# Patient Record
Sex: Female | Born: 1985 | Race: Black or African American | Hispanic: No | Marital: Single | Smoking: Never smoker
Health system: Southern US, Community
[De-identification: ages and names within clinical notes are randomized; demographics above are authoritative.]

## PROBLEM LIST (undated history)

## (undated) DIAGNOSIS — D649 Anemia, unspecified: Secondary | ICD-10-CM

## (undated) DIAGNOSIS — B009 Herpesviral infection, unspecified: Secondary | ICD-10-CM

## (undated) DIAGNOSIS — A539 Syphilis, unspecified: Secondary | ICD-10-CM

## (undated) DIAGNOSIS — K829 Disease of gallbladder, unspecified: Secondary | ICD-10-CM

## (undated) HISTORY — PX: DILATION AND CURETTAGE OF UTERUS: SHX78

## (undated) HISTORY — DX: Syphilis, unspecified: A53.9

---

## 2008-03-05 ENCOUNTER — Observation Stay: Payer: Self-pay

## 2008-03-08 ENCOUNTER — Inpatient Hospital Stay: Payer: Self-pay

## 2011-02-28 ENCOUNTER — Emergency Department (HOSPITAL_COMMUNITY)
Admission: EM | Admit: 2011-02-28 | Discharge: 2011-02-28 | Disposition: A | Discharge status: Home / Self Care | Payer: BC Managed Care – PPO | Attending: Emergency Medicine | Admitting: Emergency Medicine

## 2011-02-28 DIAGNOSIS — R21 Rash and other nonspecific skin eruption: Secondary | ICD-10-CM | POA: Insufficient documentation

## 2012-08-07 ENCOUNTER — Emergency Department: Payer: Self-pay | Admitting: *Deleted

## 2012-08-08 ENCOUNTER — Encounter (HOSPITAL_COMMUNITY): Payer: Self-pay | Admitting: *Deleted

## 2012-08-08 ENCOUNTER — Emergency Department (HOSPITAL_COMMUNITY)
Admission: EM | Admit: 2012-08-08 | Discharge: 2012-08-09 | Disposition: A | Discharge status: Home / Self Care | Payer: No Typology Code available for payment source | Attending: Emergency Medicine | Admitting: Emergency Medicine

## 2012-08-08 DIAGNOSIS — R10819 Abdominal tenderness, unspecified site: Secondary | ICD-10-CM | POA: Diagnosis not present

## 2012-08-08 DIAGNOSIS — A599 Trichomoniasis, unspecified: Secondary | ICD-10-CM | POA: Insufficient documentation

## 2012-08-08 DIAGNOSIS — M79609 Pain in unspecified limb: Secondary | ICD-10-CM | POA: Insufficient documentation

## 2012-08-08 DIAGNOSIS — R109 Unspecified abdominal pain: Secondary | ICD-10-CM | POA: Insufficient documentation

## 2012-08-08 DIAGNOSIS — N949 Unspecified condition associated with female genital organs and menstrual cycle: Secondary | ICD-10-CM | POA: Diagnosis not present

## 2012-08-08 DIAGNOSIS — T1490XA Injury, unspecified, initial encounter: Secondary | ICD-10-CM | POA: Insufficient documentation

## 2012-08-08 HISTORY — DX: Herpesviral infection, unspecified: B00.9

## 2012-08-08 MED ORDER — SODIUM CHLORIDE 0.9 % IV BOLUS (SEPSIS)
1000.0000 mL | Freq: Once | INTRAVENOUS | Status: AC
  Administered 2012-08-08: 1000 mL via INTRAVENOUS

## 2012-08-08 MED ORDER — ONDANSETRON 4 MG PO TBDP
4.0000 mg | ORAL_TABLET | Freq: Once | ORAL | Status: AC
  Administered 2012-08-08: 4 mg via ORAL
  Filled 2012-08-08: qty 1
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
## 2012-08-09 ENCOUNTER — Emergency Department (HOSPITAL_COMMUNITY): Payer: No Typology Code available for payment source

## 2012-08-09 ENCOUNTER — Encounter (HOSPITAL_COMMUNITY): Payer: Self-pay | Admitting: Radiology

## 2012-08-09 DIAGNOSIS — T1490XA Injury, unspecified, initial encounter: Secondary | ICD-10-CM | POA: Diagnosis not present

## 2012-08-09 LAB — COMPREHENSIVE METABOLIC PANEL
AST: 15 U/L (ref 0–37)
Alkaline Phosphatase: 39 U/L (ref 39–117)
BUN: 15 mg/dL (ref 6–23)
CO2: 25 mEq/L (ref 19–32)
Chloride: 104 mEq/L (ref 96–112)
Creatinine, Ser: 0.84 mg/dL (ref 0.50–1.10)
GFR calc non Af Amer: >90 mL/min (ref >90)
Potassium: 3.8 mEq/L (ref 3.5–5.1)
Total Bilirubin: 0.3 mg/dL (ref 0.3–1.2)

## 2012-08-09 LAB — CBC WITH DIFFERENTIAL/PLATELET
Basophils Absolute: 0 10*3/uL (ref 0.0–0.1)
HCT: 36.4 % (ref 36.0–46.0)
Hemoglobin: 12.4 g/dL (ref 12.0–15.0)
Lymphocytes Relative: 46 % (ref 12–46)
Monocytes Absolute: 0.4 10*3/uL (ref 0.1–1.0)
Monocytes Relative: 7 % (ref 3–12)
Neutro Abs: 2.5 10*3/uL (ref 1.7–7.7)
WBC: 5.5 10*3/uL (ref 4.0–10.5)

## 2012-08-09 LAB — URINALYSIS, ROUTINE W REFLEX MICROSCOPIC
Ketones, ur: NEGATIVE mg/dL
Protein, ur: NEGATIVE mg/dL
Urobilinogen, UA: 1 mg/dL (ref 0.0–1.0)

## 2012-08-09 LAB — URINE MICROSCOPIC-ADD ON

## 2012-08-09 LAB — WET PREP, GENITAL: Yeast Wet Prep HPF POC: NONE SEEN

## 2012-08-09 LAB — POCT PREGNANCY, URINE: Preg Test, Ur: NEGATIVE

## 2012-08-09 MED ORDER — METRONIDAZOLE 500 MG PO TABS
2000.0000 mg | ORAL_TABLET | Freq: Once | ORAL | Status: AC
  Administered 2012-08-09: 2000 mg via ORAL
  Filled 2012-08-09: qty 4

## 2012-08-09 MED ORDER — IOHEXOL 300 MG/ML  SOLN
20.0000 mL | INTRAMUSCULAR | Status: DC
  Administered 2012-08-09: 20 mL via ORAL

## 2012-08-09 MED ORDER — IOHEXOL 300 MG/ML  SOLN
100.0000 mL | Freq: Once | INTRAMUSCULAR | Status: AC | PRN
  Administered 2012-08-09: 100 mL via INTRAVENOUS
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
## 2012-08-13 LAB — GC/CHLAMYDIA PROBE AMP, GENITAL
Chlamydia, DNA Probe: POSITIVE — AB
GC Probe Amp, Genital: NEGATIVE

## 2012-08-13 NOTE — ED Provider Notes (Signed)
Medical screening examination/treatment/procedure(s) were performed by non-physician practitioner and as supervising physician I was immediately available for consultation/collaboration.   Loren Racer, MD 08/13/12 2004

## 2012-08-14 NOTE — ED Notes (Signed)
+  Chlamydia Chart sent to EDP office for review.  

## 2012-08-15 NOTE — ED Notes (Signed)
rx for Azithromycin 1 gram po per Mardella Layman

## 2012-08-16 ENCOUNTER — Telehealth (HOSPITAL_COMMUNITY): Payer: Self-pay | Admitting: *Deleted

## 2012-08-16 NOTE — ED Notes (Signed)
Voice mail message left for patient to return call. 

## 2012-08-17 ENCOUNTER — Telehealth (HOSPITAL_COMMUNITY): Payer: Self-pay | Admitting: Emergency Medicine

## 2012-08-17 NOTE — ED Notes (Signed)
Wants Rx called to [facility] on [address] in [place], [place].

## 2014-09-10 ENCOUNTER — Emergency Department: Payer: Self-pay | Admitting: Internal Medicine

## 2014-09-10 LAB — CBC WITH DIFFERENTIAL/PLATELET
Basophil #: 0 10*3/uL (ref 0.0–0.1)
Basophil %: 0.3 %
Eosinophil #: 0 10*3/uL (ref 0.0–0.7)
Eosinophil %: 0.3 %
HCT: 38.2 % (ref 35.0–47.0)
HGB: 12.4 g/dL (ref 12.0–16.0)
LYMPHS PCT: 21 %
Lymphocyte #: 1.4 10*3/uL (ref 1.0–3.6)
MCH: 29.2 pg (ref 26.0–34.0)
MCHC: 32.5 g/dL (ref 32.0–36.0)
MCV: 90 fL (ref 80–100)
MONO ABS: 0.5 x10 3/mm (ref 0.2–0.9)
MONOS PCT: 7.6 %
Neutrophil #: 4.6 10*3/uL (ref 1.4–6.5)
Neutrophil %: 70.8 %
Platelet: 312 10*3/uL (ref 150–440)
RBC: 4.26 10*6/uL (ref 3.80–5.20)
RDW: 13.3 % (ref 11.5–14.5)
WBC: 6.5 10*3/uL (ref 3.6–11.0)

## 2014-09-10 LAB — COMPREHENSIVE METABOLIC PANEL
ALK PHOS: 51 U/L
Albumin: 3.5 g/dL (ref 3.4–5.0)
Anion Gap: 6 — ABNORMAL LOW (ref 7–16)
BUN: 11 mg/dL (ref 7–18)
Bilirubin,Total: 0.5 mg/dL (ref 0.2–1.0)
CALCIUM: 9 mg/dL (ref 8.5–10.1)
CHLORIDE: 104 mmol/L (ref 98–107)
CO2: 25 mmol/L (ref 21–32)
Creatinine: 0.8 mg/dL (ref 0.60–1.30)
Glucose: 79 mg/dL (ref 65–99)
Osmolality: 268 (ref 275–301)
POTASSIUM: 3.8 mmol/L (ref 3.5–5.1)
SGOT(AST): 24 U/L (ref 15–37)
SGPT (ALT): 38 U/L
SODIUM: 135 mmol/L — AB (ref 136–145)
TOTAL PROTEIN: 8.3 g/dL — AB (ref 6.4–8.2)

## 2014-09-10 LAB — LIPASE, BLOOD: LIPASE: 151 U/L (ref 73–393)

## 2014-09-13 ENCOUNTER — Emergency Department: Payer: Self-pay | Admitting: Emergency Medicine

## 2014-09-13 LAB — CBC WITH DIFFERENTIAL/PLATELET
BASOS ABS: 0 10*3/uL (ref 0.0–0.1)
BASOS PCT: 0.6 %
EOS ABS: 0 10*3/uL (ref 0.0–0.7)
Eosinophil %: 0.4 %
HCT: 40 % (ref 35.0–47.0)
HGB: 13.2 g/dL (ref 12.0–16.0)
LYMPHS ABS: 1.4 10*3/uL (ref 1.0–3.6)
LYMPHS PCT: 22.7 %
MCH: 29.7 pg (ref 26.0–34.0)
MCHC: 33.1 g/dL (ref 32.0–36.0)
MCV: 90 fL (ref 80–100)
MONO ABS: 0.5 x10 3/mm (ref 0.2–0.9)
Monocyte %: 7.2 %
NEUTROS PCT: 69.1 %
Neutrophil #: 4.4 10*3/uL (ref 1.4–6.5)
Platelet: 312 10*3/uL (ref 150–440)
RBC: 4.45 10*6/uL (ref 3.80–5.20)
RDW: 13.3 % (ref 11.5–14.5)
WBC: 6.3 10*3/uL (ref 3.6–11.0)

## 2014-09-13 LAB — URINALYSIS, COMPLETE
BLOOD: NEGATIVE
Bilirubin,UR: NEGATIVE
GLUCOSE, UR: NEGATIVE mg/dL (ref 0–75)
Nitrite: NEGATIVE
PH: 5 (ref 4.5–8.0)
SPECIFIC GRAVITY: 1.026 (ref 1.003–1.030)

## 2014-09-13 LAB — COMPREHENSIVE METABOLIC PANEL
ALBUMIN: 3.6 g/dL (ref 3.4–5.0)
ALK PHOS: 58 U/L
ANION GAP: 7 (ref 7–16)
BILIRUBIN TOTAL: 0.5 mg/dL (ref 0.2–1.0)
BUN: 11 mg/dL (ref 7–18)
CHLORIDE: 104 mmol/L (ref 98–107)
CREATININE: 0.79 mg/dL (ref 0.60–1.30)
Calcium, Total: 8.9 mg/dL (ref 8.5–10.1)
Co2: 26 mmol/L (ref 21–32)
Glucose: 81 mg/dL (ref 65–99)
OSMOLALITY: 272 (ref 275–301)
Potassium: 4.1 mmol/L (ref 3.5–5.1)
SGOT(AST): 31 U/L (ref 15–37)
SGPT (ALT): 53 U/L
SODIUM: 137 mmol/L (ref 136–145)
TOTAL PROTEIN: 8.9 g/dL — AB (ref 6.4–8.2)

## 2014-09-13 LAB — PREGNANCY, URINE: PREGNANCY TEST, URINE: POSITIVE m[IU]/mL

## 2015-01-01 ENCOUNTER — Ambulatory Visit: Payer: Self-pay | Admitting: Obstetrics and Gynecology

## 2015-01-11 ENCOUNTER — Encounter: Payer: Self-pay | Admitting: Obstetrics and Gynecology

## 2015-01-12 ENCOUNTER — Ambulatory Visit
Admit: 2015-01-12 | Disposition: A | Discharge status: Home / Self Care | Payer: Self-pay | Attending: Obstetrics and Gynecology | Admitting: Obstetrics and Gynecology

## 2015-01-12 ENCOUNTER — Encounter
Admit: 2015-01-12 | Disposition: A | Discharge status: Home / Self Care | Payer: Self-pay | Attending: Obstetrics & Gynecology | Admitting: Obstetrics & Gynecology

## 2015-02-08 ENCOUNTER — Inpatient Hospital Stay
Admit: 2015-02-08 | Disposition: A | Discharge status: Home / Self Care | Payer: Self-pay | Attending: Advanced Practice Midwife | Admitting: Advanced Practice Midwife

## 2015-02-08 LAB — CBC WITH DIFFERENTIAL/PLATELET
BASOS ABS: 0 10*3/uL (ref 0.0–0.1)
BASOS PCT: 0.2 %
EOS PCT: 0.5 %
Eosinophil #: 0 10*3/uL (ref 0.0–0.7)
HCT: 32.5 % — AB (ref 35.0–47.0)
HGB: 10.6 g/dL — AB (ref 12.0–16.0)
LYMPHS ABS: 1.5 10*3/uL (ref 1.0–3.6)
LYMPHS PCT: 18.1 %
MCH: 28.9 pg (ref 26.0–34.0)
MCHC: 32.7 g/dL (ref 32.0–36.0)
MCV: 88 fL (ref 80–100)
MONOS PCT: 6.5 %
Monocyte #: 0.5 x10 3/mm (ref 0.2–0.9)
NEUTROS ABS: 6 10*3/uL (ref 1.4–6.5)
Neutrophil %: 74.7 %
Platelet: 253 10*3/uL (ref 150–440)
RBC: 3.69 10*6/uL — AB (ref 3.80–5.20)
RDW: 13.8 % (ref 11.5–14.5)
WBC: 8.1 10*3/uL (ref 3.6–11.0)

## 2015-02-08 LAB — GC/CHLAMYDIA PROBE AMP

## 2015-02-09 LAB — HEMATOCRIT: HCT: 31.7 % — ABNORMAL LOW (ref 35.0–47.0)

## 2015-02-11 ENCOUNTER — Encounter
Admit: 2015-02-11 | Disposition: A | Discharge status: Home / Self Care | Payer: Self-pay | Attending: Obstetrics and Gynecology | Admitting: Obstetrics and Gynecology

## 2015-02-11 ENCOUNTER — Ambulatory Visit
Admit: 2015-02-11 | Disposition: A | Discharge status: Home / Self Care | Payer: Self-pay | Admitting: Obstetrics and Gynecology

## 2015-02-11 LAB — COMPREHENSIVE METABOLIC PANEL
ALK PHOS: 72 U/L
ANION GAP: 5 — AB (ref 7–16)
Albumin: 2.7 g/dL — ABNORMAL LOW
BILIRUBIN TOTAL: 0.4 mg/dL
BUN: 11 mg/dL
CALCIUM: 8.7 mg/dL — AB
Chloride: 107 mmol/L
Co2: 25 mmol/L
Creatinine: 0.7 mg/dL
EGFR (Non-African Amer.): >60
GLUCOSE: 79 mg/dL
POTASSIUM: 3.8 mmol/L
SGOT(AST): 36 U/L
SGPT (ALT): 31 U/L
Sodium: 137 mmol/L
Total Protein: 6 g/dL — ABNORMAL LOW

## 2015-02-11 LAB — CBC
HCT: 27.4 % — AB (ref 35.0–47.0)
HGB: 9.1 g/dL — AB (ref 12.0–16.0)
MCH: 29.3 pg (ref 26.0–34.0)
MCHC: 33.2 g/dL (ref 32.0–36.0)
MCV: 88 fL (ref 80–100)
Platelet: 252 10*3/uL (ref 150–440)
RBC: 3.1 10*6/uL — ABNORMAL LOW (ref 3.80–5.20)
RDW: 14 % (ref 11.5–14.5)
WBC: 9.6 10*3/uL (ref 3.6–11.0)

## 2015-02-11 LAB — HCG, QUANTITATIVE, PREGNANCY: Beta Hcg, Quant.: 4125 m[IU]/mL — ABNORMAL HIGH

## 2015-02-12 ENCOUNTER — Encounter
Admit: 2015-02-12 | Disposition: A | Discharge status: Home / Self Care | Payer: Self-pay | Attending: Obstetrics and Gynecology | Admitting: Obstetrics and Gynecology

## 2015-03-08 LAB — SURGICAL PATHOLOGY

## 2015-03-11 ENCOUNTER — Encounter
Admit: 2015-03-11 | Disposition: A | Discharge status: Home / Self Care | Payer: Self-pay | Attending: Obstetrics and Gynecology | Admitting: Obstetrics and Gynecology

## 2015-03-14 NOTE — Consult Note (Signed)
Referral Information:  Reason for Referral Follow up of recent pregnancy loss at 29 weeks on 02/08/15.   Referring Physician Dr. Vergie LivingPickens, [facility]   Prenatal Hx 29 yo [name] weeks after an IUFD and delivery complicated by retained products requiring manual delivery of the placenta, followed by D&C on 3/31. Pregnancy was complicated by fetal abnormalities including:  lagging growth, large abdomen concerning for hepatosplenomegaly, echogenic bowel, short/abnormal long bones.  Serologic testing was done after delivery. RPR was found to be newly positive (was negative on 09/2014 at the time new ob labs were drawn) with a titer of 1:256 and positive FTA. Her anticardiolipin antibody (IgG) was slightly elevated.   Since 3/31 she has been doing well; no problems with her breasts, occasional spotting, no return yet of menses.  She has resumed intercourse and has used condoms.   Past Obstetrical Hx 1.  2009 NVD term female,  [facility] 2.  Sab x1  3.  01/31/2015 - 29 week IUFD, retained placenta   Allergies:   No Known Allergies:   (Removed):    Impression/Recommendations:  Impression 1.  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  Received PCN G 2.4 million units IM x 1 on 3/31. 2.  IUFD - pregnancy complicated by abnormal serum screen with HCG 4 MOM and normal cell free fetal DNA.  Fetus subsequently developed hepatosplenomegaly and growth restriction (excluding AC measurement).  - Autopsy was declined but babygram and placental pathology and microarray were performed. a. Placental pathology revealed chronic villitis, pigment containing macrophages within umbilical cord and membranes, large for gestational age placenta and acute chorioamnionitis.  Per discussion with the pathologist, although infectious studies performed on the placenta (specifically for Treponema) were negative, evidence of extensive infection within the placenta, recent  seroconverion of the mother and abnormalities in the fetus are all consistent with syphilis as the explantation for the demise. b. Microarray consistent with normal female c. Babygram - no obvious fractures involving the skeleton, no limb shortening or skull fractures;grossly normal. 3.  Mildly elevated ACL.   Recommendations 1.   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  She thinks her partner was also treated.  Recommend repeat RPR titer (she is seeing [facility] this week and will get this done at that visit); will need to repeat (if appropriately dropping) at 6 months and 12 months.  If titers are not appropriately dropping, will need retreatment. 2.  [facility] 3.  Consider repeat draw of the ACL after 6 weeks from the first (2 weeks from now). 4.  Mood stable - no ongoing depressive symptoms.    Total Time Spent with Patient 15 minutes   >50% of visit spent in couseling/coordination of care yes   Office Use Only 99213  Office Visit Level 3 (15min) EST exp prob focused outpt   Coding Description: OTHER: IUFD.  Maternal syphilis.  Electronic Signatures: Kirby FunkEllestad, Angela Dougherty (MD)  (Signed 28-Apr-16 11:07)  Authored: Referral, Home Medications, Allergies, Exam, Lab/Radiology Notes, Impression, Billing, Coding Description   Last Updated: 28-Apr-16 11:07 by Kirby FunkEllestad, Amarise Lillo (MD)

## 2015-03-14 NOTE — Op Note (Signed)
PATIENT NAME:  Angela Dougherty, Caidence N MR#:  [id number] DATE OF BIRTH:  Jun 29, 1986  DATE OF PROCEDURE:  02/11/2015  PREOPERATIVE DIAGNOSES: 1.  Heavy vaginal bleeding.  2.  Retained products of conception.   POSTOPERATIVE DIAGNOSES:  1.  Heavy vaginal bleeding.  2.  Retained products of conception.   PROCEDURE: Dilation and curettage.   ANESTHESIA: General.   SURGEON: Conard NovakStephen D. Artem Bunte, MD   ESTIMATED BLOOD LOSS: 200 mL.   OPERATIVE FLUIDS: 750 mL crystalloid.   COMPLICATIONS: None.   FINDINGS:  1.  Uterus enlarged to 16 weeks.  2.  Curettings consistent with blood and likely products of conception.   SPECIMENS: Endometrial curettings.   CONDITION AT END OF PROCEDURE: Stable.   PROCEDURE IN DETAIL: The patient taken to the Operating Room where general anesthesia was administered and found to be adequate. The patient was placed in the dorsal supine high lithotomy position in candy cane stirrups and prepped and draped in the usual sterile fashion. After a timeout was called, a red rubber catheter was used for in and out catheterization of the patient's bladder for approximately 300 mL of clear urine. A sterile speculum was placed in the vagina and a single-tooth tenaculum was affixed to the anterior lip of the cervix. It was noted that the cervix was already quite dilated and did not need further dilatation. The uterus was up to about the level of approximately a 16 week size uterus. Curettage was performed throughout the entire uterus until a gritty texture was noted throughout. The patient did have mild atony and was given intraoperatively Methergine 0.2 mg, which resolved the atony and her bleeding subsided. This concluded the surgical portion of the procedure and single-tooth tenaculum was removed from the anterior lip of the cervix. Hemostasis was noted at this point and all instrumentation was removed and the vagina.   The patient tolerated the procedure well. Sponge, lap, and needle  counts were correct x 2. For VTE prophylaxis, the patient was wearing pneumatic compression stockings throughout the procedure. For antibiotics, she was given doxycycline 100 mg IV postoperatively as the medication did not have time to make it into the Operating Room at the time of the surgery. She was taken to the recovery area in stable condition.    ____________________________ Conard NovakStephen D. Khyson Sebesta, MD sdj:at D: 02/11/2015 20:39:00 ET T: 02/12/2015 11:41:40 ET JOB#: [id number]  cc: Conard NovakStephen D. Patirica Longshore, MD, <Dictator> Conard NovakSTEPHEN D Plummer Matich MD ELECTRONICALLY SIGNED 03/11/2015 9:52

## 2015-03-14 NOTE — Consult Note (Signed)
Referral Information:  Reason for Referral recent pregnancy loss at 30 weeks   Referring Physician Dr Thom [facility]   Prenatal Hx 29 yo P1[name] returns  to [facility] accompanied by ehr mother and aunt. She is  recently postpartum . She experienced IUFD and delivery, She had a retained placenta that was removed manually . Serologic testing was done after delivery. RPR newly positive (neg 09/2014 in new ob labs) 1:256 pos FTA. her anticardiolipin was slightly elevated. Today pt reports having to change multiple pads and she has a low grade temp.   Past Obstetrical Hx 2009 spontaneous vaginal delivery female [facility] x1  01/31/2015 -30 week IUFD   Home Medications: Medication Instructions Status  ibuprofen 800 mg oral tablet 1  orally every 8 hours, As Needed - for Pain Active   Allergies:   No Known Allergies:   Vital Signs/Notes:  Nursing Vital Signs: **Vital Signs.:   31-Mar-16 13:05  Vital Signs Type Routine  Temperature Temperature (F) 99.2  Celsius 37.3  Pulse Pulse 98  Respirations Respirations 18  Systolic BP Systolic BP 125  Diastolic BP (mmHg) Diastolic BP (mmHg) 68  Mean BP 87  Pulse Ox % Pulse Ox % 100  Pulse Ox Activity Level  At rest  Oxygen Delivery Room Air/ 21 %   Review Of Systems:  Subjective sad appearing    Additional Lab/Radiology Notes 3/29 "babygram" negative   Impression/Recommendations:  Impression 1 bleeding low grade fever postpartum from IUFD with manual placental removal  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 Mildly elevated ACL   Recommendations 1 pt sent to ER for bleeding, pain and low grade tempt to r/o retained POCs- C Guitterez notified 2 syphilis  - early latent likely infection since 09/2014 - benzathine PCN G 2.4 million units im x1, given short duration one dose should be adequate - pt states [facility] has been in touch. I advised her partner(s) need Rx as well . I suggested she can get Rx in ER though if she has a fever  over next 48 hours cannot easily distinguish Jarisch -Herxheimer Rxn with fever and malaise  from endometritis . I suggested repeat titer in one month and Re -Rx if titer not dropping given she is likely to desire child bearing again soon. Usually 6 months and 12 motnhs are recommended intervals for re-testing.  3 f/u in 4 weeks to discuss loss and plans for future childbearing . 4- request stains or PCR of remains for syphilis.  5 I offered Zoloft if she has difficulty sleeping or unable to do what she needs to get done over next few months - reviewed signs and sxs of pp depression.    Total Time Spent with Patient 15 minutes   >50% of visit spent in couseling/coordination of care yes   Office Use Only [id number]  Office Visit Level 3 (15min) EST exp prob focused outpt   Coding Description: OTHER: IUFD  syphilis.  Electronic Signatures: Rondall AllegraLivingston, Chariah Bailey Gresham (MD)  (Signed 31-Mar-16 15:01)  Authored: Referral, Home Medications, Allergies, Vital Signs/Notes, Exam, Lab/Radiology Notes, Impression, Billing, Coding Description   Last Updated: 31-Mar-16 15:01 by Rondall AllegraLivingston, Levie Owensby Gresham (MD)

## 2015-03-23 NOTE — H&P (Signed)
L&D Evaluation:  History Expanded:  HPI [age] yo G3 P1011 with EDD of 04/23/15 per 10 wk US, presents at 3316w3d after IUFD discovered at office today. Pt reports feeling fetal movement shortly before appt today. Denies LOF, VB or ctx. [facility] and early dx of GDM. Echogenic bowel seen on anatomy US, + AFP tetra with DS risk 1:10 with negative Panorama. [facility]. Short long bones, small head measurements and enlarged AC with prominent liver and spleen.  Pt declined amnio testing.   Blood Type (Maternal) B positive   Maternal HIV Negative   Maternal Syphilis Ab Nonreactive   Maternal Varicella Immune   Rubella Results (Maternal) immune   Presents with IUFD   Patient's Medical History No Chronic Illness   Patient's Surgical History none    Medications Pre Natal Vitamins    Allergies NKDA   Social History none    Exam:  Vital Signs stable    General no apparent distress   Mental Status flat affect    Chest clear    Heart no murmur/gallop/rubs   Abdomen gravid, non-tender   Fetal Position breech per ultrasound   Pelvic no external lesions, FT/long/high   Impression:  Impression IUP at 9816w3d, IUFD   Plan:  Comments Upon pt's arrival, pt verbalized disbelief at diagnosis as she felt fetal movement this morning before appt. Repeat bedside US performed showing absent fetal heartrate. After pt and s/o had time to discuss, I reviewed options of IOL today vs later and the possibility that she may go into labor in a few days spontaneously. Pt opts to stay for IOL.  Will place cytotec vaginally after IV started and nursing staffing allows.   Chaplain up to see patient today, provider and staff also offerred support as needed   Electronic Signatures: Sukhraj Esquivias, Marta Lamasamara K (CNM)  (Signed 28-Mar-16 14:00)  Authored: L&D Evaluation   Last Updated: 28-Mar-16 14:00 by Vella KohlerBrothers, Gentri Guardado K (CNM)

## 2015-09-10 ENCOUNTER — Emergency Department
Admission: EM | Admit: 2015-09-10 | Discharge: 2015-09-10 | Disposition: A | Discharge status: Home / Self Care | Payer: 59 | Attending: Emergency Medicine | Admitting: Emergency Medicine

## 2015-09-10 ENCOUNTER — Encounter: Payer: Self-pay | Admitting: Emergency Medicine

## 2015-09-10 ENCOUNTER — Emergency Department: Payer: 59

## 2015-09-10 DIAGNOSIS — K8050 Calculus of bile duct without cholangitis or cholecystitis without obstruction: Secondary | ICD-10-CM

## 2015-09-10 DIAGNOSIS — R1011 Right upper quadrant pain: Secondary | ICD-10-CM

## 2015-09-10 DIAGNOSIS — K8020 Calculus of gallbladder without cholecystitis without obstruction: Secondary | ICD-10-CM | POA: Insufficient documentation

## 2015-09-10 DIAGNOSIS — Z3202 Encounter for pregnancy test, result negative: Secondary | ICD-10-CM | POA: Diagnosis not present

## 2015-09-10 HISTORY — DX: Disease of gallbladder, unspecified: K82.9

## 2015-09-10 LAB — URINALYSIS COMPLETE WITH MICROSCOPIC (ARMC ONLY)
BACTERIA UA: NONE SEEN
BILIRUBIN URINE: NEGATIVE
Glucose, UA: NEGATIVE mg/dL
Ketones, ur: NEGATIVE mg/dL
LEUKOCYTES UA: NEGATIVE
Nitrite: NEGATIVE
PROTEIN: NEGATIVE mg/dL
Specific Gravity, Urine: 1.019 (ref 1.005–1.030)
pH: 6 (ref 5.0–8.0)

## 2015-09-10 LAB — COMPREHENSIVE METABOLIC PANEL
ALT: 16 U/L (ref 14–54)
AST: 20 U/L (ref 15–41)
Albumin: 4 g/dL (ref 3.5–5.0)
Alkaline Phosphatase: 50 U/L (ref 38–126)
Anion gap: 8 (ref 5–15)
BUN: 13 mg/dL (ref 6–20)
CHLORIDE: 107 mmol/L (ref 101–111)
CO2: 24 mmol/L (ref 22–32)
Calcium: 8.9 mg/dL (ref 8.9–10.3)
Creatinine, Ser: 0.78 mg/dL (ref 0.44–1.00)
Glucose, Bld: 121 mg/dL — ABNORMAL HIGH (ref 65–99)
Potassium: 3.6 mmol/L (ref 3.5–5.1)
Sodium: 139 mmol/L (ref 135–145)
Total Bilirubin: 0.5 mg/dL (ref 0.3–1.2)
Total Protein: 8 g/dL (ref 6.5–8.1)

## 2015-09-10 LAB — LIPASE, BLOOD: LIPASE: 29 U/L (ref 11–51)

## 2015-09-10 LAB — POCT PREGNANCY, URINE: PREG TEST UR: NEGATIVE

## 2015-09-10 LAB — CBC
HEMATOCRIT: 36.8 % (ref 35.0–47.0)
Hemoglobin: 11.8 g/dL — ABNORMAL LOW (ref 12.0–16.0)
MCH: 25.7 pg — ABNORMAL LOW (ref 26.0–34.0)
MCHC: 32.1 g/dL (ref 32.0–36.0)
MCV: 80.1 fL (ref 80.0–100.0)
PLATELETS: 288 10*3/uL (ref 150–440)
RBC: 4.59 MIL/uL (ref 3.80–5.20)
RDW: 18.1 % — ABNORMAL HIGH (ref 11.5–14.5)
WBC: 5.5 10*3/uL (ref 3.6–11.0)

## 2015-09-10 NOTE — ED Provider Notes (Signed)
CSN: [id number]     Arrival date & time 09/10/15  1628 History   First MD Initiated Contact with Patient 09/10/15 1822     Chief Complaint  Patient presents with  . Flank Pain     (Consider location/radiation/quality/duration/timing/severity/associated sxs/prior Treatment) HPI  29 year old female presents to the emergency department for evaluation of right upper quadrant pain. Patient is pain is currently mild 2 out of 10 located in the right upper quadrant. Pain radiates to her right scapular region. She describes the pain as a dull ache that is increased after eating fatty meals. Pain can occasionally be sharp. She will have episodes of pain lasting a couple of hours. Pain was increased today after eating a meal. Patient states she had ultrasound earlier this year that did show sludge in her gallbladder. She has had off and on pain over the last 10 months in her right upper quadrant. She denies any fevers, vomiting. She has not taken any medications for pain. She does admit to drinking dark sodas and eating fatty meals recently.   Past Medical History  Diagnosis Date  . Herpes   . Gallbladder disease    Past Surgical History  Procedure Laterality Date  . Dilation and curettage of uterus  March 31st, 2016   No family history on file. Social History  Substance Use Topics  . Smoking status: Never Smoker   . Smokeless tobacco: None  . Alcohol Use: No   OB History    No data available     Review of Systems  Constitutional: Negative for fever, chills, activity change and fatigue.  HENT: Negative for congestion, sinus pressure and sore throat.   Eyes: Negative for visual disturbance.  Respiratory: Negative for cough, chest tightness and shortness of breath.   Cardiovascular: Negative for chest pain and leg swelling.  Gastrointestinal: Positive for abdominal pain. Negative for nausea, vomiting and diarrhea.  Genitourinary: Negative for dysuria.  Musculoskeletal: Negative for  arthralgias and gait problem.  Skin: Negative for rash.  Neurological: Negative for weakness, numbness and headaches.  Hematological: Negative for adenopathy.  Psychiatric/Behavioral: Negative for behavioral problems, confusion and agitation.      Allergies  Review of patient's allergies indicates no known allergies.  Home Medications   Prior to Admission medications   Medication Sig Start Date End Date Taking? Authorizing Provider  acyclovir (ZOVIRAX) 400 MG tablet Take 400 mg by mouth as needed. For outbreaks    Historical Provider, MD  diclofenac (VOLTAREN) 75 MG EC tablet Take 75 mg by mouth 2 (two) times daily as needed. For pain    Historical Provider, MD  HYDROcodone-acetaminophen (NORCO/VICODIN) 5-325 MG per tablet Take 1 tablet by mouth every 6 (six) hours as needed. For pain    Historical Provider, MD   BP 136/75 mmHg  Pulse 82  Temp(Src) 98.2 F (36.8 C) (Oral)  Resp 20  Ht 5' 2.5" (1.588 m)  Wt 200 lb (90.719 kg)  BMI 35.97 kg/m2  SpO2 98%  LMP 09/05/2015 (Exact Date) Physical Exam  Constitutional: She is oriented to person, place, and time. She appears well-developed and well-nourished. No distress.  HENT:  Head: Normocephalic and atraumatic.  Mouth/Throat: Oropharynx is clear and moist.  Eyes: EOM are normal. Pupils are equal, round, and reactive to light. Right eye exhibits no discharge. Left eye exhibits no discharge.  Neck: Normal range of motion. Neck supple.  Cardiovascular: Normal rate, regular rhythm and intact distal pulses.   Pulmonary/Chest: Effort normal and breath sounds normal. No  respiratory distress. She exhibits no tenderness.  Abdominal: Soft. She exhibits no distension and no mass. There is tenderness (minimal right upper quadrant tenderness with deep palpation.). There is no rebound and no guarding.  Musculoskeletal: Normal range of motion. She exhibits no edema.  Neurological: She is alert and oriented to person, place, and time. She has  normal reflexes.  Skin: Skin is warm and dry.  Psychiatric: She has a normal mood and affect. Her behavior is normal. Thought content normal.  Nursing note and vitals reviewed.   ED Course  Procedures (including critical care time) Labs Review Labs Reviewed  COMPREHENSIVE METABOLIC PANEL - Abnormal; Notable for the following:    Glucose, Bld 121 (*)    All other components within normal limits  CBC - Abnormal; Notable for the following:    Hemoglobin 11.8 (*)    MCH 25.7 (*)    RDW 18.1 (*)    All other components within normal limits  URINALYSIS COMPLETEWITH MICROSCOPIC ([facility] ONLY) - Abnormal; Notable for the following:    Color, Urine YELLOW (*)    APPearance CLEAR (*)    Hgb urine dipstick 2+ (*)    Squamous Epithelial / LPF 0-5 (*)    All other components within normal limits  LIPASE, BLOOD  POC URINE PREG, ED  POCT PREGNANCY, URINE    Imaging Review Koreas Abdomen Limited Ruq  09/10/2015  CLINICAL DATA:  RIGHT upper quadrant pain for 2 weeks EXAM: US ABDOMEN LIMITED - RIGHT UPPER QUADRANT COMPARISON:  CT abdomen 08/09/2012 FINDINGS: Gallbladder: Small of sludge within the gallbladder. No gallbladder wall thickening or pericholecystic fluid. No echogenic gallstones are identified. Negative sonographic Murphy's sign. Common bile duct: Diameter: Normal caliber at 3 mm Liver: Liver parenchyma is mildly increased in echogenicity. No duct dilatation. IMPRESSION: 1. Gallbladder sludge without evidence cholecystitis. 2. Normal common bile duct 3. Mild increased liver echogenicity. Finding commonly represents hepatic steatosis. Electronically Signed   By: Genevive BiStewart  Edmunds M.D.   On: 09/10/2015 18:13   I have personally reviewed and evaluated these images and lab results as part of my medical decision-making.   EKG Interpretation None      MDM   Final diagnoses:  Biliary colic    29 year old female presents to the emergency department for evaluation of right upper quadrant  pain. She has a history of biliary colic due to sludge within the gallbladder. Patient has had intermittent pain that increased today after a fatty meal in the right upper quadrant. She has been afebrile, without nausea, without vomiting. Pain is 2 out of 10. Ultrasound today shows no evidence of cholecystitis. Labs, urine is unremarkable. Patient was educated on biliary colic. She will drink clear fluids, avoid fatty meals and large intake at one time.    Evon Slackhomas C Gaines, PA-C 09/10/15 1850  Emily FilbertJonathan E Williams, MD 09/10/15 2249

## 2015-09-10 NOTE — ED Notes (Addendum)
Patient presents to the ED with right flank pain and upper right quadrant abdominal pain x 2 weekn.  Patient states pain is nagging throughout the day and then increases in the evening.  Patient states after eating greasy foods or dark soda pain is worse.  Patient denies dysuria or urinary frequency.  Patient denies vomiting and diarrhea.  Patient states she was diagnosed with "galbladder disease" about 1 year ago.

## 2015-09-10 NOTE — Discharge Instructions (Signed)
Biliary Colic °Biliary colic is a pain in the upper abdomen. The pain: °· Is usually felt on the right side of the abdomen, but it may also be felt in the center of the abdomen, just below the breastbone (sternum). °· May spread back toward the right shoulder blade. °· May be steady or irregular. °· May be accompanied by nausea and vomiting. °Most of the time, the pain goes away in 1-5 hours. After the most intense pain passes, the abdomen may continue to ache mildly for about 24 hours. °Biliary colic is caused by a blockage in the bile duct. The bile duct is a pathway that carries bile--a liquid that helps to digest fats--from the gallbladder to the small intestine. Biliary colic usually occurs after eating, when the digestive system demands bile. The pain develops when muscle cells contract forcefully to try to move the blockage so that bile can get by. °HOME CARE INSTRUCTIONS °· Take medicines only as directed by your health care provider. °· Drink enough fluid to keep your urine clear or pale yellow. °· Avoid fatty, greasy, and fried foods. These kinds of foods increase your body's demand for bile. °· Avoid any foods that make your pain worse. °· Avoid overeating. °· Avoid having a large meal after fasting. °SEEK MEDICAL CARE IF: °· You develop a fever. °· Your pain gets worse. °· You vomit. °· You develop nausea that prevents you from eating and drinking. °SEEK IMMEDIATE MEDICAL CARE IF: °· You suddenly develop a fever and shaking chills. °· You develop a yellowish discoloration (jaundice) of: °¨ Skin. °¨ Whites of the eyes. °¨ Mucous membranes. °· You have continuous or severe pain that is not relieved with medicines. °· You have nausea and vomiting that is not relieved with medicines. °· You develop dizziness or you faint. °  °This information is not intended to replace advice given to you by your health care provider. Make sure you discuss any questions you have with your health care provider. °  °Document  Released: 04/02/2006 Document Revised: 03/16/2015 Document Reviewed: 08/11/2014 °Elsevier Interactive Patient Education ©2016 Elsevier Inc. ° °

## 2015-09-22 ENCOUNTER — Encounter: Payer: Self-pay | Admitting: Surgery

## 2015-09-22 ENCOUNTER — Telehealth: Payer: Self-pay

## 2015-09-22 ENCOUNTER — Ambulatory Visit (INDEPENDENT_AMBULATORY_CARE_PROVIDER_SITE_OTHER): Payer: 59 | Admitting: Surgery

## 2015-09-22 VITALS — BP 152/90 | HR 84 | Temp 98.6°F | Ht 63.0 in | Wt 211.6 lb

## 2015-09-22 DIAGNOSIS — K8050 Calculus of bile duct without cholangitis or cholecystitis without obstruction: Secondary | ICD-10-CM | POA: Diagnosis not present

## 2015-09-22 NOTE — Patient Instructions (Signed)
You are requesting to have your gallbladder removed. Our surgery scheduler will call you with the details of this surgery.  Please refer to Pre-care surgery form that you have been given at time of appointment.  You will need to arrange to be off work for around 10 days.

## 2015-09-22 NOTE — Progress Notes (Signed)
Angela Dougherty is an 29 y.o. female.   Chief Complaint:  Recurrent and episodic right upper quadrant pain HPI:  This a patient who is in the emergency room and a workup showed gallstones with normal liver function tests.  Past Medical History  Diagnosis Date  . Herpes   . Gallbladder disease     Past Surgical History  Procedure Laterality Date  . Dilation and curettage of uterus  March 31st, 2016    No family history on file. Social History:  reports that she has never smoked. She does not have any smokeless tobacco history on file. She reports that she does not drink alcohol or use illicit drugs.  Allergies: No Known Allergies   (Not in a hospital admission)   Review of Systems:   Review of Systems  Constitutional: Negative for fever and chills.  HENT: Negative.   Eyes: Negative.   Respiratory: Negative.   Cardiovascular: Negative.   Gastrointestinal: Positive for abdominal pain and constipation. Negative for heartburn, nausea, vomiting and diarrhea.  Genitourinary: Negative.   Musculoskeletal: Negative.   Skin: Negative.   Neurological: Negative.   Endo/Heme/Allergies: Negative.   Psychiatric/Behavioral: Negative.     Physical Exam:  Physical Exam  Constitutional: She is oriented to person, place, and time and well-developed, well-nourished, and in no distress. No distress.  HENT:  Head: Normocephalic and atraumatic.  Eyes: Pupils are equal, round, and reactive to light. Right eye exhibits no discharge. Left eye exhibits no discharge. No scleral icterus.  Neck: Normal range of motion. Neck supple.  Cardiovascular: Normal rate, regular rhythm and normal heart sounds.   Pulmonary/Chest: Effort normal and breath sounds normal. No respiratory distress. She has no wheezes. She has no rales.  Abdominal: Soft. She exhibits no distension. There is no tenderness. There is no rebound and no guarding.  Musculoskeletal: Normal range of motion. She exhibits no edema.   Lymphadenopathy:    She has no cervical adenopathy.  Neurological: She is alert and oriented to person, place, and time.  Skin: Skin is warm and dry. No rash noted. No erythema.  Psychiatric: Mood, affect and judgment normal.    Last menstrual period 09/05/2015.    No results found for this or any previous visit (from the past 48 hour(s)). No results found.   Assessment/Plan  recurrent and episodic right upper quadrant pain suggestive of biliary colic. Recommend laparoscopic cholecystectomy for control of her symptoms. The options of observation were reviewed and the risks of bleeding infection recurrence of symptoms failure to resolve her symptoms conversion to an open procedure bile duct damage bile duct leak retained common bile duct stone any of which could require further surgery were all reviewed with her she understood and agreed to proceed  Lattie Hawichard E Karlina Suares, MD, FACS

## 2015-09-22 NOTE — Telephone Encounter (Signed)
Patient called you to let you know that she would like to have surgery on 10/04/2015 if possible. Please return her call.

## 2015-09-23 NOTE — Telephone Encounter (Signed)
Please schedule patient for Laparoscopic Cholecystectomy on 11/21 with Dr. Excell Seltzerooper per patient preference at [facility]. If there is a problem, please let me know.

## 2015-09-24 NOTE — Telephone Encounter (Signed)
I have called to advise patient of pre op date/time and sx date. No answer. I have left a message for patient to call office.  Sx: 10/20/15 with Dr Ludwig Clarksooper--Laparoscopic Cholecystectomy Pre op: 10/04/15 @ 10:00am--Office.

## 2015-09-28 NOTE — Telephone Encounter (Signed)
Patient has decided to cancel surgery at this time due to her being unsure of needing the surgery and will discuss this further with her primary care physician.

## 2015-10-04 ENCOUNTER — Other Ambulatory Visit: Payer: 59

## 2015-10-20 ENCOUNTER — Ambulatory Visit: Admission: RE | Admit: 2015-10-20 | Payer: 59 | Source: Ambulatory Visit | Admitting: Surgery

## 2015-10-20 ENCOUNTER — Encounter: Admission: RE | Payer: Self-pay | Source: Ambulatory Visit

## 2015-10-20 SURGERY — LAPAROSCOPIC CHOLECYSTECTOMY
Anesthesia: General

## 2015-11-17 DIAGNOSIS — R1011 Right upper quadrant pain: Secondary | ICD-10-CM | POA: Diagnosis not present

## 2015-11-17 DIAGNOSIS — R7309 Other abnormal glucose: Secondary | ICD-10-CM | POA: Diagnosis not present

## 2015-11-17 DIAGNOSIS — Z0000 Encounter for general adult medical examination without abnormal findings: Secondary | ICD-10-CM | POA: Diagnosis not present

## 2015-11-17 DIAGNOSIS — E6609 Other obesity due to excess calories: Secondary | ICD-10-CM | POA: Diagnosis not present

## 2015-11-17 DIAGNOSIS — Z79899 Other long term (current) drug therapy: Secondary | ICD-10-CM | POA: Diagnosis not present

## 2015-11-17 DIAGNOSIS — Z8639 Personal history of other endocrine, nutritional and metabolic disease: Secondary | ICD-10-CM | POA: Diagnosis not present

## 2015-11-17 DIAGNOSIS — Z13220 Encounter for screening for lipoid disorders: Secondary | ICD-10-CM | POA: Diagnosis not present

## 2015-11-26 DIAGNOSIS — Z8639 Personal history of other endocrine, nutritional and metabolic disease: Secondary | ICD-10-CM | POA: Diagnosis not present

## 2015-12-01 ENCOUNTER — Other Ambulatory Visit: Payer: Self-pay | Admitting: Internal Medicine

## 2015-12-01 DIAGNOSIS — R1011 Right upper quadrant pain: Secondary | ICD-10-CM

## 2015-12-01 DIAGNOSIS — R102 Pelvic and perineal pain: Secondary | ICD-10-CM

## 2015-12-02 ENCOUNTER — Other Ambulatory Visit: Payer: Self-pay | Admitting: Internal Medicine

## 2015-12-10 ENCOUNTER — Ambulatory Visit: Payer: 59

## 2015-12-10 ENCOUNTER — Ambulatory Visit
Admission: RE | Admit: 2015-12-10 | Discharge: 2015-12-10 | Disposition: A | Discharge status: Home / Self Care | Payer: 59 | Source: Ambulatory Visit | Attending: Internal Medicine | Admitting: Internal Medicine

## 2015-12-10 DIAGNOSIS — R1011 Right upper quadrant pain: Secondary | ICD-10-CM | POA: Diagnosis not present

## 2015-12-10 DIAGNOSIS — R102 Pelvic and perineal pain: Secondary | ICD-10-CM | POA: Diagnosis not present

## 2015-12-10 MED ORDER — IOHEXOL 350 MG/ML SOLN
100.0000 mL | Freq: Once | INTRAVENOUS | Status: AC | PRN
  Administered 2015-12-10: 100 mL via INTRAVENOUS

## 2016-01-27 DIAGNOSIS — Z3A09 9 weeks gestation of pregnancy: Secondary | ICD-10-CM | POA: Diagnosis not present

## 2016-01-27 DIAGNOSIS — N926 Irregular menstruation, unspecified: Secondary | ICD-10-CM | POA: Diagnosis not present

## 2016-01-27 DIAGNOSIS — Z3201 Encounter for pregnancy test, result positive: Secondary | ICD-10-CM | POA: Diagnosis not present

## 2016-01-31 DIAGNOSIS — O0991 Supervision of high risk pregnancy, unspecified, first trimester: Secondary | ICD-10-CM | POA: Diagnosis not present

## 2016-01-31 DIAGNOSIS — O09899 Supervision of other high risk pregnancies, unspecified trimester: Secondary | ICD-10-CM | POA: Diagnosis not present

## 2016-02-03 DIAGNOSIS — O99211 Obesity complicating pregnancy, first trimester: Secondary | ICD-10-CM | POA: Diagnosis not present

## 2016-02-03 DIAGNOSIS — O98111 Syphilis complicating pregnancy, first trimester: Secondary | ICD-10-CM | POA: Diagnosis not present

## 2016-02-03 DIAGNOSIS — E669 Obesity, unspecified: Secondary | ICD-10-CM | POA: Diagnosis not present

## 2016-02-03 DIAGNOSIS — Z8759 Personal history of other complications of pregnancy, childbirth and the puerperium: Secondary | ICD-10-CM | POA: Diagnosis not present

## 2016-02-03 DIAGNOSIS — O0991 Supervision of high risk pregnancy, unspecified, first trimester: Secondary | ICD-10-CM | POA: Diagnosis not present

## 2016-02-03 DIAGNOSIS — Z6837 Body mass index (BMI) 37.0-37.9, adult: Secondary | ICD-10-CM | POA: Diagnosis not present

## 2016-02-03 DIAGNOSIS — Z3A10 10 weeks gestation of pregnancy: Secondary | ICD-10-CM | POA: Diagnosis not present

## 2016-02-03 DIAGNOSIS — Z3A01 Less than 8 weeks gestation of pregnancy: Secondary | ICD-10-CM | POA: Diagnosis not present

## 2016-02-03 DIAGNOSIS — Z36 Encounter for antenatal screening of mother: Secondary | ICD-10-CM | POA: Diagnosis not present

## 2016-02-07 DIAGNOSIS — Z3042 Encounter for surveillance of injectable contraceptive: Secondary | ICD-10-CM | POA: Diagnosis not present

## 2016-02-11 DIAGNOSIS — Z331 Pregnant state, incidental: Secondary | ICD-10-CM | POA: Diagnosis not present

## 2016-02-11 DIAGNOSIS — O99810 Abnormal glucose complicating pregnancy: Secondary | ICD-10-CM | POA: Diagnosis not present

## 2016-03-01 DIAGNOSIS — Z3A10 10 weeks gestation of pregnancy: Secondary | ICD-10-CM | POA: Diagnosis not present

## 2016-03-01 DIAGNOSIS — O99211 Obesity complicating pregnancy, first trimester: Secondary | ICD-10-CM | POA: Diagnosis not present

## 2016-03-01 DIAGNOSIS — O98111 Syphilis complicating pregnancy, first trimester: Secondary | ICD-10-CM | POA: Diagnosis not present

## 2016-03-10 DIAGNOSIS — N939 Abnormal uterine and vaginal bleeding, unspecified: Secondary | ICD-10-CM | POA: Diagnosis not present

## 2016-03-10 DIAGNOSIS — O200 Threatened abortion: Secondary | ICD-10-CM | POA: Diagnosis not present

## 2016-03-12 ENCOUNTER — Emergency Department
Admission: EM | Admit: 2016-03-12 | Discharge: 2016-03-12 | Disposition: A | Discharge status: Home / Self Care | Payer: 59 | Attending: Emergency Medicine | Admitting: Emergency Medicine

## 2016-03-12 ENCOUNTER — Encounter: Payer: Self-pay | Admitting: Emergency Medicine

## 2016-03-12 ENCOUNTER — Emergency Department: Payer: 59

## 2016-03-12 DIAGNOSIS — O209 Hemorrhage in early pregnancy, unspecified: Secondary | ICD-10-CM | POA: Diagnosis not present

## 2016-03-12 DIAGNOSIS — R1030 Lower abdominal pain, unspecified: Secondary | ICD-10-CM | POA: Diagnosis not present

## 2016-03-12 DIAGNOSIS — O039 Complete or unspecified spontaneous abortion without complication: Secondary | ICD-10-CM | POA: Diagnosis not present

## 2016-03-12 DIAGNOSIS — Z3A00 Weeks of gestation of pregnancy not specified: Secondary | ICD-10-CM | POA: Diagnosis not present

## 2016-03-12 LAB — HCG, QUANTITATIVE, PREGNANCY: HCG, BETA CHAIN, QUANT, S: 2651 m[IU]/mL — AB (ref <5)

## 2016-03-12 LAB — ABO/RH: ABO/RH(D): B POS

## 2016-03-12 MED ORDER — HYDROCODONE-ACETAMINOPHEN 5-325 MG PO TABS: 1.0000 | ORAL_TABLET | Freq: Four times a day (QID) | ORAL | Status: DC | PRN

## 2016-03-12 MED ORDER — ONDANSETRON 4 MG PO TBDP: 4.0000 mg | ORAL_TABLET | Freq: Once | ORAL | Status: DC

## 2016-03-12 NOTE — Discharge Instructions (Signed)
Please call Dr. Tiburcio PeaHarris Monday morning to arrange an appointment for Monday. He states he will fit you into the schedule that day. Please take your pain medication as needed for lower abdominal pain/cramping. You may also take ibuprofen/Motrin as written on the box. Return to the emergency department for any worsening pain, significant increase in bleeding, or any other symptom personally concerning to yourself.  Miscarriage A miscarriage is the sudden loss of an unborn baby (fetus) before the 20th week of pregnancy. Most miscarriages happen in the first 3 months of pregnancy. Sometimes, it happens before a woman even knows she is pregnant. A miscarriage is also called a "spontaneous miscarriage" or "early pregnancy loss." Having a miscarriage can be an emotional experience. Talk with your caregiver about any questions you may have about miscarrying, the grieving process, and your future pregnancy plans. CAUSES   Problems with the fetal chromosomes that make it impossible for the baby to develop normally. Problems with the baby's genes or chromosomes are most often the result of errors that occur, by chance, as the embryo divides and grows. The problems are not inherited from the parents.  Infection of the cervix or uterus.   Hormone problems.   Problems with the cervix, such as having an incompetent cervix. This is when the tissue in the cervix is not strong enough to hold the pregnancy.   Problems with the uterus, such as an abnormally shaped uterus, uterine fibroids, or congenital abnormalities.   Certain medical conditions.   Smoking, drinking alcohol, or taking illegal drugs.   Trauma.  Often, the cause of a miscarriage is unknown.  SYMPTOMS   Vaginal bleeding or spotting, with or without cramps or pain.  Pain or cramping in the abdomen or lower back.  Passing fluid, tissue, or blood clots from the vagina. DIAGNOSIS  Your caregiver will perform a physical exam. You may also  have an ultrasound to confirm the miscarriage. Blood or urine tests may also be ordered. TREATMENT   Sometimes, treatment is not necessary if you naturally pass all the fetal tissue that was in the uterus. If some of the fetus or placenta remains in the body (incomplete miscarriage), tissue left behind may become infected and must be removed. Usually, a dilation and curettage (D and C) procedure is performed. During a D and C procedure, the cervix is widened (dilated) and any remaining fetal or placental tissue is gently removed from the uterus.  Antibiotic medicines are prescribed if there is an infection. Other medicines may be given to reduce the size of the uterus (contract) if there is a lot of bleeding.  If you have Rh negative blood and your baby was Rh positive, you will need a Rh immunoglobulin shot. This shot will protect any future baby from having Rh blood problems in future pregnancies. HOME CARE INSTRUCTIONS   Your caregiver may order bed rest or may allow you to continue light activity. Resume activity as directed by your caregiver.  Have someone help with home and family responsibilities during this time.   Keep track of the number of sanitary pads you use each day and how soaked (saturated) they are. Write down this information.   Do not use tampons. Do not douche or have sexual intercourse until approved by your caregiver.   Only take over-the-counter or prescription medicines for pain or discomfort as directed by your caregiver.   Do not take aspirin. Aspirin can cause bleeding.   Keep all follow-up appointments with your caregiver.  If you or your partner have problems with grieving, talk to your caregiver or seek counseling to help cope with the pregnancy loss. Allow enough time to grieve before trying to get pregnant again.  SEEK IMMEDIATE MEDICAL CARE IF:   You have severe cramps or pain in your back or abdomen.  You have a fever.  You pass large blood  clots (walnut-sized or larger) ortissue from your vagina. Save any tissue for your caregiver to inspect.   Your bleeding increases.   You have a thick, bad-smelling vaginal discharge.  You become lightheaded, weak, or you faint.   You have chills.  MAKE SURE YOU:  Understand these instructions.  Will watch your condition.  Will get help right away if you are not doing well or get worse.   This information is not intended to replace advice given to you by your health care provider. Make sure you discuss any questions you have with your health care provider.   Document Released: 04/25/2001 Document Revised: 02/24/2013 Document Reviewed: 12/19/2011 Elsevier Interactive Patient Education [employer]

## 2016-03-12 NOTE — ED Notes (Addendum)
Patient states that she is about [redacted] weeks pregnant. Patient reports that she has been having light vaginal bleeding that started last Friday. Patient states that she was seen by here OB and was told that her cervix was closed. Patient states that tonight she started having heaving bleeding and lower abd cramping that started about 02:00. Patient reports passing multiple blood clots.

## 2016-03-12 NOTE — ED Notes (Signed)
Pt alert and oriented X4, active, cooperative, pt in NAD. RR even and unlabored, color WNL.  Pt informed to return if any life threatening symptoms occur.   

## 2016-03-12 NOTE — ED Provider Notes (Signed)
[facility] Emergency Department Provider Note  Time seen: 7:35 AM  I have reviewed the triage vital signs and the nursing notes.   HISTORY  Chief Complaint Vaginal Bleeding    HPI Angela Dougherty is a 30 y.o. female with no past medical history presents to the emergency department with vaginal bleeding approximately [redacted] weeks pregnant. According to the patient for the past 9 days she has had vaginal spotting. She saw her OB 2 days ago and had a pelvic exam performed, at that time they heard a "faint heartbeat." Patient states she passed several clots this morning so she came to the emergency department for evaluation. Patient is describing lower abdominal cramping but denies any "pain." Patient states a history of one previous stillborn child.    Past Medical History  Diagnosis Date  . Herpes   . Gallbladder disease     There are no active problems to display for this patient.   Past Surgical History  Procedure Laterality Date  . Dilation and curettage of uterus  March 31st, 2016    Current Outpatient Rx  Name  Route  Sig  Dispense  Refill  . Prenatal Vit-Fe Fumarate-FA (PRENATAL MULTIVITAMIN) TABS tablet   Oral   Take 1 tablet by mouth daily at 12 noon.           Allergies Review of patient's allergies indicates no known allergies.  Family History  Problem Relation Age of Onset  . Breast cancer Mother   . Hypertension Mother   . Gallbladder disease Mother   . Asthma Daughter     Social History Social History  Substance Use Topics  . Smoking status: Never Smoker   . Smokeless tobacco: Never Used  . Alcohol Use: No     Comment: Occasional    Review of Systems Constitutional: Negative for fever. Cardiovascular: Negative for chest pain. Respiratory: Negative for shortness of breath. Gastrointestinal: Negative for abdominal pain Musculoskeletal: Negative for back pain. Neurological: Negative for headache 10-point ROS otherwise  negative.  ____________________________________________   PHYSICAL EXAM:  VITAL SIGNS: ED Triage Vitals  Enc Vitals Group     BP 03/12/16 0531 130/95 mmHg     Pulse Rate 03/12/16 0531 96     Resp 03/12/16 0531 18     Temp 03/12/16 0531 98 F (36.7 C)     Temp src --      SpO2 03/12/16 0531 99 %     Weight 03/12/16 0531 218 lb (98.884 kg)     Height 03/12/16 0531 5\' 3"  (1.6 m)     Head Cir --      Peak Flow --      Pain Score 03/12/16 0532 9     Pain Loc --      Pain Edu? --      Excl. in GC? --     Constitutional: Alert and oriented. Well appearing and in no distress. Eyes: Normal exam ENT   Head: Normocephalic and atraumatic.   Mouth/Throat: Mucous membranes are moist. Cardiovascular: Normal rate, regular rhythm. No murmur Respiratory: Normal respiratory effort without tachypnea nor retractions. Breath sounds are clear  Gastrointestinal: Soft and nontender. No distention.   Musculoskeletal: Nontender with normal range of motion in all extremities. Neurologic:  Normal speech and language. No gross focal neurologic deficits  Skin:  Skin is warm, dry and intact.  Psychiatric: Mood and affect are normal.   ____________________________________________   RADIOLOGY  Ultrasound consistent with likely miscarriage and retained products  of conception  ____________________________________________   INITIAL IMPRESSION / ASSESSMENT AND PLAN / ED COURSE  Pertinent labs & imaging results that were available during my care of the patient were reviewed by me and considered in my medical decision making (see chart for details).  Patient presents to the emergency department approximately [redacted] weeks pregnant with lower abdominal cramping and vaginal bleeding. Patient had a pelvic exam performed 2 days ago per patient by her OB has follow-up in 3 days at the [facility] office. We will check an Ultrasound. Nontender abdominal exam.  A pelvic exam patient has minimal clot in the cervix,  I removed the clot with ringed forceps. No obvious products of conception. Mild amount of bleeding. I discussed the ultrasound results with Dr. Tiburcio Pea who states he was see the patient in the office tomorrow to decide upon D&C. At this point the patient has normal vitals, afebrile, states the cramping and bleeding are mild at this point. Patient will follow up with Dr. Tiburcio Pea tomorrow. On pelvic exam cervix is fingertip open. ____________________________________________   FINAL CLINICAL IMPRESSION(S) / ED DIAGNOSES  Miscarriage   Minna Antis, MD 03/12/16 1110

## 2016-03-13 DIAGNOSIS — O039 Complete or unspecified spontaneous abortion without complication: Secondary | ICD-10-CM | POA: Diagnosis not present

## 2016-03-27 DIAGNOSIS — O039 Complete or unspecified spontaneous abortion without complication: Secondary | ICD-10-CM | POA: Diagnosis not present

## 2016-04-03 DIAGNOSIS — Z30430 Encounter for insertion of intrauterine contraceptive device: Secondary | ICD-10-CM | POA: Diagnosis not present

## 2016-04-19 DIAGNOSIS — Z30430 Encounter for insertion of intrauterine contraceptive device: Secondary | ICD-10-CM | POA: Diagnosis not present

## 2016-04-19 DIAGNOSIS — O039 Complete or unspecified spontaneous abortion without complication: Secondary | ICD-10-CM | POA: Diagnosis not present

## 2016-05-24 DIAGNOSIS — Z30431 Encounter for routine checking of intrauterine contraceptive device: Secondary | ICD-10-CM | POA: Diagnosis not present

## 2016-06-06 DIAGNOSIS — Z30431 Encounter for routine checking of intrauterine contraceptive device: Secondary | ICD-10-CM | POA: Diagnosis not present

## 2016-06-06 DIAGNOSIS — N854 Malposition of uterus: Secondary | ICD-10-CM | POA: Diagnosis not present

## 2016-08-10 ENCOUNTER — Other Ambulatory Visit
Admission: RE | Admit: 2016-08-10 | Discharge: 2016-08-10 | Disposition: A | Discharge status: Home / Self Care | Payer: 59 | Source: Ambulatory Visit | Attending: Physician Assistant | Admitting: Physician Assistant

## 2016-08-10 ENCOUNTER — Other Ambulatory Visit: Payer: Self-pay | Admitting: Physician Assistant

## 2016-08-10 DIAGNOSIS — R1011 Right upper quadrant pain: Secondary | ICD-10-CM | POA: Diagnosis not present

## 2016-08-10 LAB — COMPREHENSIVE METABOLIC PANEL
ALBUMIN: 4.5 g/dL (ref 3.5–5.0)
ALK PHOS: 94 U/L (ref 38–126)
ALT: 194 U/L — AB (ref 14–54)
ANION GAP: 8 (ref 5–15)
AST: 107 U/L — ABNORMAL HIGH (ref 15–41)
BILIRUBIN TOTAL: 3.4 mg/dL — AB (ref 0.3–1.2)
BUN: 11 mg/dL (ref 6–20)
CALCIUM: 9.5 mg/dL (ref 8.9–10.3)
CO2: 27 mmol/L (ref 22–32)
CREATININE: 0.8 mg/dL (ref 0.44–1.00)
Chloride: 102 mmol/L (ref 101–111)
GFR calc Af Amer: >60 mL/min (ref >60)
GFR calc non Af Amer: >60 mL/min (ref >60)
GLUCOSE: 101 mg/dL — AB (ref 65–99)
Potassium: 3.9 mmol/L (ref 3.5–5.1)
SODIUM: 137 mmol/L (ref 135–145)
TOTAL PROTEIN: 9 g/dL — AB (ref 6.5–8.1)

## 2016-08-11 ENCOUNTER — Ambulatory Visit
Admission: RE | Admit: 2016-08-11 | Discharge: 2016-08-11 | Disposition: A | Discharge status: Home / Self Care | Payer: 59 | Source: Ambulatory Visit | Attending: Physician Assistant | Admitting: Physician Assistant

## 2016-08-11 DIAGNOSIS — R1011 Right upper quadrant pain: Secondary | ICD-10-CM | POA: Diagnosis not present

## 2016-08-11 DIAGNOSIS — K760 Fatty (change of) liver, not elsewhere classified: Secondary | ICD-10-CM | POA: Diagnosis not present

## 2016-08-11 DIAGNOSIS — K8020 Calculus of gallbladder without cholecystitis without obstruction: Secondary | ICD-10-CM | POA: Insufficient documentation

## 2016-08-15 ENCOUNTER — Other Ambulatory Visit: Payer: Self-pay | Admitting: Physician Assistant

## 2016-08-15 DIAGNOSIS — R1011 Right upper quadrant pain: Secondary | ICD-10-CM | POA: Diagnosis not present

## 2016-08-15 DIAGNOSIS — R7989 Other specified abnormal findings of blood chemistry: Secondary | ICD-10-CM | POA: Diagnosis not present

## 2016-08-15 DIAGNOSIS — K8020 Calculus of gallbladder without cholecystitis without obstruction: Secondary | ICD-10-CM

## 2016-08-15 DIAGNOSIS — N390 Urinary tract infection, site not specified: Secondary | ICD-10-CM | POA: Diagnosis not present

## 2016-08-31 ENCOUNTER — Ambulatory Visit
Admission: RE | Admit: 2016-08-31 | Discharge: 2016-08-31 | Disposition: A | Discharge status: Home / Self Care | Payer: 59 | Source: Ambulatory Visit | Attending: Physician Assistant | Admitting: Physician Assistant

## 2016-08-31 ENCOUNTER — Other Ambulatory Visit: Payer: Self-pay | Admitting: Physician Assistant

## 2016-08-31 DIAGNOSIS — K8020 Calculus of gallbladder without cholecystitis without obstruction: Secondary | ICD-10-CM

## 2016-08-31 DIAGNOSIS — R1011 Right upper quadrant pain: Secondary | ICD-10-CM | POA: Diagnosis not present

## 2016-08-31 MED ORDER — TECHNETIUM TC 99M MEBROFENIN IV KIT
5.0000 | PACK | Freq: Once | INTRAVENOUS | Status: AC | PRN
  Administered 2016-08-31: 5 via INTRAVENOUS

## 2016-09-05 DIAGNOSIS — K8010 Calculus of gallbladder with chronic cholecystitis without obstruction: Secondary | ICD-10-CM | POA: Diagnosis not present

## 2016-09-19 ENCOUNTER — Encounter
Admission: RE | Admit: 2016-09-19 | Discharge: 2016-09-19 | Disposition: A | Discharge status: Home / Self Care | Payer: 59 | Source: Ambulatory Visit | Attending: Surgery | Admitting: Surgery

## 2016-09-19 HISTORY — DX: Anemia, unspecified: D64.9

## 2016-09-19 NOTE — Patient Instructions (Signed)
  Your procedure is scheduled on: 09-28-16 Report to Same Day Surgery 2nd floor medical mall To find out your arrival time please call [phone] between 1PM - 3PM on 09-27-16  Remember: Instructions that are not followed completely may result in serious medical risk, up to and including death, or upon the discretion of your surgeon and anesthesiologist your surgery may need to be rescheduled.    _x___ 1. Do not eat food or drink liquids after midnight. No gum chewing or hard candies.     __x__ 2. No Alcohol for 24 hours before or after surgery.   __x__3. No Smoking for 24 prior to surgery.   ____  4. Bring all medications with you on the day of surgery if instructed.    __x__ 5. Notify your doctor if there is any change in your medical condition     (cold, fever, infections).     Do not wear jewelry, make-up, hairpins, clips or nail polish.  Do not wear lotions, powders, or perfumes. You may wear deodorant.  Do not shave 48 hours prior to surgery. Men may shave face and neck.  Do not bring valuables to the hospital.    [facility] is not responsible for any belongings or valuables.               Contacts, dentures or bridgework may not be worn into surgery.  Leave your suitcase in the car. After surgery it may be brought to your room.  For patients admitted to the hospital, discharge time is determined by your treatment team.   Patients discharged the day of surgery will not be allowed to drive home.    Please read over the following fact sheets that you were given:   [place] Preparing for Surgery and or MRSA Information   ____ Take these medicines the morning of surgery with A SIP OF WATER:    1. NONE  2.  3.  4.  5.  6.  ____Fleets enema or Magnesium Citrate as directed.   ____ Use CHG Soap or sage wipes as directed on instruction sheet   ____ Use inhalers on the day of surgery and bring to hospital day of surgery  ____ Stop metformin 2 days prior to  surgery    ____ Take 1/2 of usual insulin dose the night before surgery and none on the morning of  surgery.   ____ Stop aspirin or coumadin, or plavix  x__ Stop Anti-inflammatories such as Advil, Aleve, Ibuprofen, Motrin, Naproxen,          Naprosyn, Goodies powders or aspirin products. Ok to take Tylenol.   ____ Stop supplements until after surgery.    ____ Bring C-Pap to the hospital.    

## 2016-09-28 ENCOUNTER — Encounter
Admission: RE | Disposition: A | Discharge status: Home / Self Care | Payer: Self-pay | Source: Ambulatory Visit | Attending: Surgery

## 2016-09-28 ENCOUNTER — Ambulatory Visit: Payer: 59 | Admitting: Certified Registered Nurse Anesthetist

## 2016-09-28 ENCOUNTER — Observation Stay: Payer: 59 | Admitting: Anesthesiology

## 2016-09-28 ENCOUNTER — Observation Stay
Admission: RE | Admit: 2016-09-28 | Discharge: 2016-09-29 | Disposition: A | Discharge status: Home / Self Care | Payer: 59 | Source: Ambulatory Visit | Attending: Surgery | Admitting: Surgery

## 2016-09-28 ENCOUNTER — Ambulatory Visit: Payer: 59

## 2016-09-28 ENCOUNTER — Encounter: Payer: Self-pay | Admitting: *Deleted

## 2016-09-28 DIAGNOSIS — K819 Cholecystitis, unspecified: Secondary | ICD-10-CM

## 2016-09-28 DIAGNOSIS — G43909 Migraine, unspecified, not intractable, without status migrainosus: Secondary | ICD-10-CM | POA: Diagnosis not present

## 2016-09-28 DIAGNOSIS — K8010 Calculus of gallbladder with chronic cholecystitis without obstruction: Secondary | ICD-10-CM | POA: Diagnosis not present

## 2016-09-28 DIAGNOSIS — K8065 Calculus of gallbladder and bile duct with chronic cholecystitis with obstruction: Secondary | ICD-10-CM | POA: Diagnosis not present

## 2016-09-28 DIAGNOSIS — K811 Chronic cholecystitis: Secondary | ICD-10-CM | POA: Diagnosis present

## 2016-09-28 DIAGNOSIS — K8021 Calculus of gallbladder without cholecystitis with obstruction: Secondary | ICD-10-CM | POA: Diagnosis present

## 2016-09-28 DIAGNOSIS — M797 Fibromyalgia: Secondary | ICD-10-CM | POA: Diagnosis not present

## 2016-09-28 DIAGNOSIS — D649 Anemia, unspecified: Secondary | ICD-10-CM | POA: Insufficient documentation

## 2016-09-28 DIAGNOSIS — K8050 Calculus of bile duct without cholangitis or cholecystitis without obstruction: Secondary | ICD-10-CM | POA: Diagnosis not present

## 2016-09-28 HISTORY — PX: CHOLECYSTECTOMY: SHX55

## 2016-09-28 HISTORY — PX: ENDOSCOPIC RETROGRADE CHOLANGIOPANCREATOGRAPHY (ERCP) WITH PROPOFOL: SHX5810

## 2016-09-28 LAB — HEPATIC FUNCTION PANEL
ALK PHOS: 84 U/L (ref 38–126)
ALT: 149 U/L — ABNORMAL HIGH (ref 14–54)
AST: 67 U/L — ABNORMAL HIGH (ref 15–41)
Albumin: 4.2 g/dL (ref 3.5–5.0)
BILIRUBIN INDIRECT: 0.2 mg/dL — AB (ref 0.3–0.9)
BILIRUBIN TOTAL: 0.4 mg/dL (ref 0.3–1.2)
Bilirubin, Direct: 0.2 mg/dL (ref 0.1–0.5)
TOTAL PROTEIN: 8 g/dL (ref 6.5–8.1)

## 2016-09-28 LAB — POCT PREGNANCY, URINE: Preg Test, Ur: NEGATIVE

## 2016-09-28 LAB — LIPASE, BLOOD: LIPASE: 23 U/L (ref 11–51)

## 2016-09-28 SURGERY — LAPAROSCOPIC CHOLECYSTECTOMY WITH INTRAOPERATIVE CHOLANGIOGRAM
Anesthesia: General

## 2016-09-28 SURGERY — ENDOSCOPIC RETROGRADE CHOLANGIOPANCREATOGRAPHY (ERCP) WITH PROPOFOL
Anesthesia: General

## 2016-09-28 MED ORDER — MORPHINE SULFATE (PF) 4 MG/ML IV SOLN
1.0000 mg | INTRAVENOUS | Status: DC | PRN
  Administered 2016-09-28 (×3): 2 mg via INTRAVENOUS
  Filled 2016-09-28 (×3): qty 1

## 2016-09-28 MED ORDER — SODIUM CHLORIDE 0.9 % IJ SOLN
INTRAMUSCULAR | Status: AC
  Filled 2016-09-28: qty 50

## 2016-09-28 MED ORDER — ESMOLOL HCL 100 MG/10ML IV SOLN
INTRAVENOUS | Status: DC | PRN
  Administered 2016-09-28: 5 mg via INTRAVENOUS

## 2016-09-28 MED ORDER — LACTATED RINGERS IV SOLN
INTRAVENOUS | Status: DC | PRN
  Administered 2016-09-28: 07:00:00 via INTRAVENOUS

## 2016-09-28 MED ORDER — FAMOTIDINE 20 MG PO TABS
ORAL_TABLET | ORAL | Status: AC
  Filled 2016-09-28: qty 1

## 2016-09-28 MED ORDER — MIDAZOLAM HCL 5 MG/5ML IJ SOLN
INTRAMUSCULAR | Status: DC | PRN
  Administered 2016-09-28: 1 mg via INTRAVENOUS

## 2016-09-28 MED ORDER — DEXAMETHASONE SODIUM PHOSPHATE 10 MG/ML IJ SOLN
INTRAMUSCULAR | Status: DC | PRN
  Administered 2016-09-28: 4 mg via INTRAVENOUS

## 2016-09-28 MED ORDER — LIDOCAINE HCL (CARDIAC) 20 MG/ML IV SOLN
INTRAVENOUS | Status: DC | PRN
  Administered 2016-09-28: 50 mg via INTRAVENOUS

## 2016-09-28 MED ORDER — PROPOFOL 10 MG/ML IV BOLUS
INTRAVENOUS | Status: DC | PRN
  Administered 2016-09-28: 30 mg via INTRAVENOUS
  Administered 2016-09-28: 20 mg via INTRAVENOUS

## 2016-09-28 MED ORDER — LACTATED RINGERS IV SOLN
INTRAVENOUS | Status: DC
  Administered 2016-09-28: 07:00:00 via INTRAVENOUS

## 2016-09-28 MED ORDER — SODIUM CHLORIDE 0.9 % IV SOLN
INTRAVENOUS | Status: DC | PRN
  Administered 2016-09-28: 13 mL

## 2016-09-28 MED ORDER — SODIUM CHLORIDE 0.9 % IV SOLN
INTRAVENOUS | Status: DC
  Administered 2016-09-28: 17:00:00 via INTRAVENOUS

## 2016-09-28 MED ORDER — FAMOTIDINE 20 MG PO TABS
20.0000 mg | ORAL_TABLET | Freq: Once | ORAL | Status: AC
  Administered 2016-09-28: 20 mg via ORAL

## 2016-09-28 MED ORDER — FENTANYL CITRATE (PF) 100 MCG/2ML IJ SOLN
INTRAMUSCULAR | Status: DC | PRN
  Administered 2016-09-28: 100 ug via INTRAVENOUS
  Administered 2016-09-28 (×3): 50 ug via INTRAVENOUS

## 2016-09-28 MED ORDER — SUCCINYLCHOLINE CHLORIDE 20 MG/ML IJ SOLN
INTRAMUSCULAR | Status: DC | PRN
  Administered 2016-09-28: 100 mg via INTRAVENOUS

## 2016-09-28 MED ORDER — HEPARIN SODIUM (PORCINE) 5000 UNIT/ML IJ SOLN
INTRAMUSCULAR | Status: AC
  Filled 2016-09-28: qty 1

## 2016-09-28 MED ORDER — PROPOFOL 500 MG/50ML IV EMUL
INTRAVENOUS | Status: DC | PRN
  Administered 2016-09-28: 160 ug/kg/min via INTRAVENOUS

## 2016-09-28 MED ORDER — FENTANYL CITRATE (PF) 100 MCG/2ML IJ SOLN
INTRAMUSCULAR | Status: AC
  Filled 2016-09-28: qty 2

## 2016-09-28 MED ORDER — SUGAMMADEX SODIUM 200 MG/2ML IV SOLN
INTRAVENOUS | Status: DC | PRN
  Administered 2016-09-28: 180 mg via INTRAVENOUS

## 2016-09-28 MED ORDER — FENTANYL CITRATE (PF) 100 MCG/2ML IJ SOLN
25.0000 ug | INTRAMUSCULAR | Status: AC | PRN
Start: 2016-09-28 — End: 2016-09-28
  Administered 2016-09-28 (×6): 25 ug via INTRAVENOUS

## 2016-09-28 MED ORDER — LIDOCAINE HCL (CARDIAC) 20 MG/ML IV SOLN
INTRAVENOUS | Status: DC | PRN
  Administered 2016-09-28: 60 mg via INTRAVENOUS

## 2016-09-28 MED ORDER — ROCURONIUM BROMIDE 100 MG/10ML IV SOLN
INTRAVENOUS | Status: DC | PRN
  Administered 2016-09-28: 10 mg via INTRAVENOUS
  Administered 2016-09-28 (×2): 5 mg via INTRAVENOUS
  Administered 2016-09-28: 35 mg via INTRAVENOUS

## 2016-09-28 MED ORDER — ACETAMINOPHEN 325 MG PO TABS: 650.0000 mg | ORAL_TABLET | Freq: Four times a day (QID) | ORAL | Status: DC | PRN

## 2016-09-28 MED ORDER — ONDANSETRON HCL 4 MG/2ML IJ SOLN
INTRAMUSCULAR | Status: AC
  Filled 2016-09-28: qty 2

## 2016-09-28 MED ORDER — GLYCOPYRROLATE 0.2 MG/ML IJ SOLN
INTRAMUSCULAR | Status: DC | PRN
  Administered 2016-09-28: 0.2 mg via INTRAVENOUS

## 2016-09-28 MED ORDER — ONDANSETRON HCL 4 MG/2ML IJ SOLN
INTRAMUSCULAR | Status: DC | PRN
  Administered 2016-09-28: 4 mg via INTRAVENOUS

## 2016-09-28 MED ORDER — PROPOFOL 10 MG/ML IV BOLUS
INTRAVENOUS | Status: DC | PRN
  Administered 2016-09-28: 200 mg via INTRAVENOUS

## 2016-09-28 MED ORDER — HYDROCODONE-ACETAMINOPHEN 5-325 MG PO TABS
1.0000 | ORAL_TABLET | ORAL | Status: DC | PRN
  Administered 2016-09-28 – 2016-09-29 (×2): 1 via ORAL
  Administered 2016-09-29: 2 via ORAL
  Filled 2016-09-28: qty 2
  Filled 2016-09-28 (×2): qty 1

## 2016-09-28 MED ORDER — MIDAZOLAM HCL 2 MG/2ML IJ SOLN
INTRAMUSCULAR | Status: DC | PRN
  Administered 2016-09-28: 2 mg via INTRAVENOUS

## 2016-09-28 MED ORDER — ACETAMINOPHEN 650 MG RE SUPP: 650.0000 mg | Freq: Four times a day (QID) | RECTAL | Status: DC | PRN

## 2016-09-28 MED ORDER — KCL IN DEXTROSE-NACL 20-5-0.2 MEQ/L-%-% IV SOLN
INTRAVENOUS | Status: DC
  Administered 2016-09-28 – 2016-09-29 (×3): via INTRAVENOUS
  Filled 2016-09-28 (×5): qty 1000

## 2016-09-28 MED ORDER — INDOMETHACIN 50 MG RE SUPP
100.0000 mg | Freq: Once | RECTAL | Status: AC
  Administered 2016-09-28: 100 mg via RECTAL
  Filled 2016-09-28 (×2): qty 2

## 2016-09-28 MED ORDER — ONDANSETRON HCL 4 MG/2ML IJ SOLN
4.0000 mg | Freq: Once | INTRAMUSCULAR | Status: AC | PRN
Start: 2016-09-28 — End: 2016-09-28
  Administered 2016-09-28: 4 mg via INTRAVENOUS

## 2016-09-28 SURGICAL SUPPLY — 41 items
APPLIER CLIP ROT 10 11.4 M/L (STAPLE) ×2
BENZOIN TINCTURE PRP APPL 2/3 (GAUZE/BANDAGES/DRESSINGS) ×2 IMPLANT
BULB RESERV EVAC DRAIN JP 100C (MISCELLANEOUS) ×2 IMPLANT
CANISTER SUCT 1200ML W/VALVE (MISCELLANEOUS) ×2 IMPLANT
CANNULA DILATOR 10 W/SLV (CANNULA) ×2 IMPLANT
CATH REDDICK CHOLANGI 4FR 50CM (CATHETERS) ×2 IMPLANT
CHLORAPREP W/TINT 26ML (MISCELLANEOUS) ×2 IMPLANT
CLIP APPLIE ROT 10 11.4 M/L (STAPLE) ×1 IMPLANT
CORD MONOPOLAR M/FML 12FT (MISCELLANEOUS) ×4 IMPLANT
DRAIN CHANNEL JP 15F RND 16 (MISCELLANEOUS) ×2 IMPLANT
DRAPE SHEET LG 3/4 BI-LAMINATE (DRAPES) ×2 IMPLANT
ELECT REM PT RETURN 9FT ADLT (ELECTROSURGICAL) ×2
ELECTRODE REM PT RTRN 9FT ADLT (ELECTROSURGICAL) ×1 IMPLANT
GAUZE SPONGE 4X4 12PLY STRL (GAUZE/BANDAGES/DRESSINGS) ×2 IMPLANT
GLOVE BIO SURGEON STRL SZ7.5 (GLOVE) ×8 IMPLANT
GOWN STRL REUS W/ TWL LRG LVL3 (GOWN DISPOSABLE) ×5 IMPLANT
GOWN STRL REUS W/TWL LRG LVL3 (GOWN DISPOSABLE) ×5
IRRIGATION STRYKERFLOW (MISCELLANEOUS) ×1 IMPLANT
IRRIGATOR STRYKERFLOW (MISCELLANEOUS) ×2
IV NS 1000ML (IV SOLUTION) ×1
IV NS 1000ML BAXH (IV SOLUTION) ×1 IMPLANT
KIT RM TURNOVER STRD PROC AR (KITS) ×2 IMPLANT
LABEL OR SOLS (LABEL) ×2 IMPLANT
NDL INSUFF ACCESS 14 VERSASTEP (NEEDLE) ×2 IMPLANT
NEEDLE FILTER BLUNT 18X 1/2SAF (NEEDLE) ×2
NEEDLE FILTER BLUNT 18X1 1/2 (NEEDLE) ×2 IMPLANT
NS IRRIG 500ML POUR BTL (IV SOLUTION) ×2 IMPLANT
PACK LAP CHOLECYSTECTOMY (MISCELLANEOUS) ×2 IMPLANT
SCISSORS METZENBAUM CVD 33 (INSTRUMENTS) ×2 IMPLANT
SEAL FOR SCOPE WARMER C3101 (MISCELLANEOUS) ×2 IMPLANT
SLEEVE ENDOPATH XCEL 5M (ENDOMECHANICALS) ×2 IMPLANT
STRIP CLOSURE SKIN 1/2X4 (GAUZE/BANDAGES/DRESSINGS) ×2 IMPLANT
STRIP CLOSURE SKIN 1/4X4 (GAUZE/BANDAGES/DRESSINGS) ×2 IMPLANT
SUT CHROMIC 5 0 RB 1 27 (SUTURE) ×2 IMPLANT
SUT ETHILON 3-0 KS 30 BLK (SUTURE) ×2 IMPLANT
SUT VIC AB 0 CT2 27 (SUTURE) IMPLANT
SYR 3ML LL SCALE MARK (SYRINGE) ×2 IMPLANT
TROCAR XCEL NON-BLD 11X100MML (ENDOMECHANICALS) ×2 IMPLANT
TROCAR XCEL NON-BLD 5MMX100MML (ENDOMECHANICALS) ×2 IMPLANT
TUBING INSUFFLATOR HI FLOW (MISCELLANEOUS) ×2 IMPLANT
WATER STERILE IRR 1000ML POUR (IV SOLUTION) ×2 IMPLANT

## 2016-09-28 NOTE — Op Note (Addendum)
[facility] Gastroenterology Patient Name: Angela HaberLakeshia Dougherty Procedure Date: 09/28/2016 4:44 PM MRN: [id number] Account #: [id number] Date of Birth: 1985-12-27 Admit Type: Inpatient Age: [age] [facility] ROOM 4 Gender: Female Note Status: Finalized Procedure:            ERCP Indications:          Bile duct stone(s) Providers:            Midge Miniumarren Kaizlee Carlino MD, MD Referring MD:         Duane LopeJeffrey D. Judithann SheenSparks, MD (Referring MD) Medicines:            Propofol per Anesthesia Complications:        No immediate complications. Procedure:            Pre-Anesthesia Assessment:                       - Prior to the procedure, a History and Physical was                        performed, and patient medications and allergies were                        reviewed. The patient's tolerance of previous                        anesthesia was also reviewed. The risks and benefits of                        the procedure and the sedation options and risks were                        discussed with the patient. All questions were                        answered, and informed consent was obtained. Prior                        Anticoagulants: The patient has taken no previous                        anticoagulant or antiplatelet agents. ASA Grade                        Assessment: II - A patient with mild systemic disease.                        After reviewing the risks and benefits, the patient was                        deemed in satisfactory condition to undergo the                        procedure.                       After obtaining informed consent, the scope was passed                        under direct vision. Throughout the procedure, the  patient's blood pressure, pulse, and oxygen saturations                        were monitored continuously. The Endosonoscope was                        introduced through the mouth, and used to inject   contrast into and used to inject contrast into the bile                        duct. The ERCP was accomplished without difficulty. The                        patient tolerated the procedure well. Findings:      A scout film of the abdomen was obtained. One percutaneous drain ending       in the Right upper quadrant was seen. The major papilla was bulging.       Stone crowning out of ampulla, The bile duct was deeply cannulated with       the short-nosed traction sphincterotome. Contrast was injected. I       personally interpreted the bile duct images. There was brisk flow of       contrast through the ducts. Image quality was excellent. Contrast       extended to the entire biliary tree. The lower third of the main bile       duct contained one stone mm. A wire was passed into the biliary tree.       Biliary sphincterotomy was made with a traction (standard)       sphincterotome using ERBE electrocautery. There was no       post-sphincterotomy bleeding. The biliary tree was swept with a 15 mm       balloon starting at the bifurcation. One stone was removed. No stones       remained. Impression:           - The major papilla appeared to be bulging.                       - Choledocholithiasis was found. Complete removal was                        accomplished by biliary sphincterotomy and balloon                        extraction.                       - A biliary sphincterotomy was performed.                       - The biliary tree was swept. Recommendation:       - Watch for pancreatitis, bleeding, perforation, and                        cholangitis.                       - Return patient to hospital ward for ongoing care. Procedure Code(s):    --- Professional ---  [phone], Endoscopic retrograde cholangiopancreatography                        (ERCP); with removal of calculi/debris from                        biliary/pancreatic duct(s)                       [id number],  Endoscopic retrograde cholangiopancreatography                        (ERCP); with sphincterotomy/papillotomy                       [phone], Endoscopic catheterization of the biliary ductal                        system, radiological supervision and interpretation Diagnosis Code(s):    --- Professional ---                       K80.50, Calculus of bile duct without cholangitis or                        cholecystitis without obstruction                       K83.9, Disease of biliary tract, unspecified CPT copyright 2016 American Medical Association. All rights reserved. The codes documented in this report are preliminary and upon coder review may  be revised to meet current compliance requirements. Midge Miniumarren Candia Kingsbury MD, MD 09/28/2016 5:32:38 PM This report has been signed electronically. Number of Addenda: 0 Note Initiated On: 09/28/2016 4:44 PM      [facility]

## 2016-09-28 NOTE — Op Note (Signed)
OPERATIVE REPORT  PREOPERATIVE DIAGNOSIS:  Chronic cholecystitis cholelithiasis  POSTOPERATIVE DIAGNOSIS: Chronic cholecystitis cholelithiasis, choledocholithiasis  PROCEDURE: Laparoscopic cholecystectomy with cholangiogram  ANESTHESIA: General  SURGEON: Renda RollsWilton Laelle Bridgett M.D.  INDICATIONS: She has history of epigastric pain and transient elevation of bilirubin and ultrasound findings of gallstones. Her bilirubin at time of pain prior to surgical consultation was 3.4 and later after surgical consultation was 0.7   Surgery was recommended for definitive treatment.    With the patient on the operating table in the supine position under general endotracheal anesthesia the abdomen was prepared with ChloraPrep solution and draped in a sterile manner. A short incision was made in the inferior aspect of the umbilicus and carried down to the deep fascia which was grasped with a laryngeal hook. A Veress needle was inserted aspirated and irrigated with a saline solution. The peritoneal cavity was insufflated with carbon dioxide. The Veress needle was removed. The 10 mm cannula was inserted. The 10 mm 0 laparoscope was inserted to view the peritoneal cavity.  Another incision was made in the epigastrium slightly to the right of the midline to introduce an 11 mm cannula. 2 incisions were made in the lateral aspect of the right upper quadrant to introduce 2   5 mm cannulas. Initial inspection revealed the gallbladder. The liver had a smooth surface. Brief survey of the stomach portion of colon and small bowel appeared typical  The gallbladder was retracted towards the right shoulder.  The gallbladder neck was retracted inferiorly and laterally.  The porta hepatis was identified. The gallbladder was mobilized with incision of the visceral peritoneum. The cystic duct was dissected free from surrounding structures. The cystic artery was dissected free from surrounding structures. A critical view of safety was  demonstrated the cystic artery was divided between endoclips to allow better exposure of the cystic duct. An Endo Clip was placed across the cystic duct adjacent to the gallbladder neck. An incision was made in the cystic duct to introduce a Reddick catheter. The cholangiogram was done with injection of half-strength Conray 60 dye. This demonstrated the bile ducts. There appeared to be a stone at the distal common bile duct and there was no flow of dye into the duodenum. Some additional dye was injected and waited for 1 minute and subsequent film demonstrated no flow in the duodenum. The cystic duct was doubly ligated with endoclips and divided. . The gallbladder was dissected free from the liver with use of hook and cautery and blunt dissection. Bleeding was minimal and hemostasis was intact. The gallbladder was delivered up through the infraumbilical incision opened and suctioned.  Multiple small stones were removed with the stone scoop. The gallbladder was then delivered up out of the abdomen and submitted with stones in formalin for routine pathology. Due to findings of obstruction of the distal common bile duct a Blake drain was inserted and brought out through the lowermost right mid abdominal port site. The drain was secured to the skin with a 3-0 nylon suture. Hemostasis was intact and there was no bile leakage. The remaining ports were removed. Carbon dioxide was allowed to escape from the peritoneal cavity.  The skin incisions were closed with interrupted 5-0 chromic subcuticular suture benzoin and Steri-Strips. The drain was activated and had just a trace of serosanguineous fluid. Gauze dressings were applied with paper tape.  The patient appeared to be in satisfactory condition and was prepared for transfer to the recovery room. Plan is for gastroenterology consultation to do ERCP.  Rochel Brome M.D.

## 2016-09-28 NOTE — Transfer of Care (Signed)
Immediate Anesthesia Transfer of Care Note  Patient: Angela Dougherty  Procedure(s) Performed: Procedure(s): ENDOSCOPIC RETROGRADE CHOLANGIOPANCREATOGRAPHY (ERCP) WITH PROPOFOL (N/A)  Patient Location: PACU  Anesthesia Type:General  Level of Consciousness: sedated  Airway & Oxygen Therapy: Patient Spontanous Breathing and Patient connected to nasal cannula oxygen  Post-op Assessment: Report given to RN and Post -op Vital signs reviewed and stable  Post vital signs: Reviewed and stable  Last Vitals:  Vitals:   09/28/16 1607 09/28/16 1732  BP: 132/82 141/91  Pulse: 88 104  Resp: 16 18  Temp: 36.7 C 36.7 C    Last Pain:  Vitals:   09/28/16 1732  TempSrc: Tympanic  PainSc:          Complications: No apparent anesthesia complications

## 2016-09-28 NOTE — Anesthesia Procedure Notes (Signed)
Procedure Name: Intubation Date/Time: 09/28/2016 7:35 AM Performed by: Marlana SalvageJESSUP, Karson Chicas Pre-anesthesia Checklist: Patient identified, Emergency Drugs available, Suction available, Patient being monitored and Timeout performed Patient Re-evaluated:Patient Re-evaluated prior to inductionOxygen Delivery Method: Circle system utilized Preoxygenation: Pre-oxygenation with 100% oxygen Intubation Type: IV induction Ventilation: Mask ventilation without difficulty Laryngoscope Size: Mac and 3 Grade View: Grade I Tube type: Oral Tube size: 7.0 mm Number of attempts: 1 Airway Equipment and Method: Stylet Placement Confirmation: ETT inserted through vocal cords under direct vision,  positive ETCO2 and breath sounds checked- equal and bilateral Secured at: 21 cm Tube secured with: Tape Dental Injury: Teeth and Oropharynx as per pre-operative assessment  Comments: Gums edematous and red prior to Dl.  Gums bleeding after intubation, no cuts noted, no change in lips or teeth.

## 2016-09-28 NOTE — Anesthesia Postprocedure Evaluation (Signed)
Anesthesia Post Note  Patient: Angela Dougherty Code  Procedure(s) Performed: Procedure(s) (LRB): ENDOSCOPIC RETROGRADE CHOLANGIOPANCREATOGRAPHY (ERCP) WITH PROPOFOL (Dougherty/A)  Patient location during evaluation: Endoscopy Anesthesia Type: General Level of consciousness: awake and alert Pain management: pain level controlled Vital Signs Assessment: post-procedure vital signs reviewed and stable Respiratory status: spontaneous breathing, nonlabored ventilation, respiratory function stable and patient connected to nasal cannula oxygen Cardiovascular status: blood pressure returned to baseline and stable Postop Assessment: no signs of nausea or vomiting Anesthetic complications: no    Last Vitals:  Vitals:   09/28/16 1732 09/28/16 1752  BP: (!) 147/98 (!) 151/100  Pulse: (!) 104 (!) 103  Resp: (!) 27 16  Temp: 36.7 C     Last Pain:  Vitals:   09/28/16 1732  TempSrc: Tympanic  PainSc: 0-No pain                 Cleda MccreedyJoseph K Piscitello

## 2016-09-28 NOTE — Transfer of Care (Signed)
Immediate Anesthesia Transfer of Care Note  Patient: Angela Dougherty  Procedure(s) Performed: Procedure(s): LAPAROSCOPIC CHOLECYSTECTOMY WITH INTRAOPERATIVE CHOLANGIOGRAM (N/A)  Patient Location: PACU  Anesthesia Type:General  Level of Consciousness: awake and alert   Airway & Oxygen Therapy: Patient Spontanous Breathing and Patient connected to nasal cannula oxygen  Post-op Assessment: Report given to RN and Post -op Vital signs reviewed and stable  Post vital signs: Reviewed and stable  Last Vitals:  Vitals:   09/28/16 0619 09/28/16 0941  BP: 126/66 (!) 129/98  Pulse: 75 97  Resp: 18 19  Temp: 36.8 C 36.3 C    Last Pain:  Vitals:   09/28/16 0941  TempSrc:   PainSc: Asleep         Complications: No apparent anesthesia complications

## 2016-09-28 NOTE — Anesthesia Preprocedure Evaluation (Signed)
Anesthesia Evaluation  Patient identified by MRN, date of birth, ID band Patient awake    Reviewed: Allergy & Precautions, H&P , NPO status , Patient's Chart, lab work & pertinent test results  History of Anesthesia Complications Negative for: history of anesthetic complications  Airway Mallampati: II  TM Distance: >3 FB Neck ROM: full    Dental  (+) Poor Dentition, Chipped   Pulmonary neg pulmonary ROS, neg shortness of breath,    Pulmonary exam normal breath sounds clear to auscultation       Cardiovascular Exercise Tolerance: Good (-) angina(-) Past MI and (-) DOE negative cardio ROS Normal cardiovascular exam Rhythm:regular Rate:Normal     Neuro/Psych negative neurological ROS  negative psych ROS   GI/Hepatic negative GI ROS, Neg liver ROS, neg GERD  ,  Endo/Other  negative endocrine ROS  Renal/GU negative Renal ROS  negative genitourinary   Musculoskeletal   Abdominal   Peds  Hematology negative hematology ROS (+)   Anesthesia Other Findings Patient is NPO appropriate and reports no nausea or vomiting today.  Past Medical History: No date: Anemia     Comment: H/O 8 YRSGO No date: Gallbladder disease No date: Herpes  Past Surgical History: 09/28/2016: CHOLECYSTECTOMY N/A     Comment: Procedure: LAPAROSCOPIC CHOLECYSTECTOMY WITH               INTRAOPERATIVE CHOLANGIOGRAM;  Surgeon: Nadeen LandauJarvis               Wilton Smith, MD;  Location: [facility] ORS;                Service: General;  Laterality: N/A; March 31st, 2016: DILATION AND CURETTAGE OF UTERUS  BMI    Body Mass Index:  35.78 kg/m      Reproductive/Obstetrics negative OB ROS                             Anesthesia Physical Anesthesia Plan  ASA: III  Anesthesia Plan: General   Post-op Pain Management:    Induction:   Airway Management Planned:   Additional Equipment:   Intra-op Plan:   Post-operative Plan:    Informed Consent: I have reviewed the patients History and Physical, chart, labs and discussed the procedure including the risks, benefits and alternatives for the proposed anesthesia with the patient or authorized representative who has indicated his/her understanding and acceptance.   Dental Advisory Given  Plan Discussed with: Anesthesiologist, CRNA and Surgeon  Anesthesia Plan Comments:         Anesthesia Quick Evaluation

## 2016-09-28 NOTE — Anesthesia Postprocedure Evaluation (Signed)
Anesthesia Post Note  Patient: Angela Dougherty  Procedure(s) Performed: Procedure(s) (LRB): LAPAROSCOPIC CHOLECYSTECTOMY WITH INTRAOPERATIVE CHOLANGIOGRAM (N/A)  Patient location during evaluation: PACU Anesthesia Type: General Level of consciousness: awake and alert and oriented Pain management: pain level controlled Vital Signs Assessment: post-procedure vital signs reviewed and stable Respiratory status: spontaneous breathing Cardiovascular status: blood pressure returned to baseline Anesthetic complications: no    Last Vitals:  Vitals:   09/28/16 1122 09/28/16 1255  BP: 128/73 134/77  Pulse: 92 76  Resp: 16 17  Temp: 37.1 C     Last Pain:  Vitals:   09/28/16 1122  TempSrc: Oral  PainSc:                  [name]

## 2016-09-28 NOTE — Anesthesia Preprocedure Evaluation (Signed)
Anesthesia Evaluation  Patient identified by MRN, date of birth, ID band Patient awake    Reviewed: Allergy & Precautions, NPO status , Patient's Chart, lab work & pertinent test results  Airway Mallampati: III  TM Distance: <3 FB     Dental no notable dental hx.    Pulmonary neg pulmonary ROS,    Pulmonary exam normal        Cardiovascular negative cardio ROS Normal cardiovascular exam     Neuro/Psych negative neurological ROS  negative psych ROS   GI/Hepatic Neg liver ROS,   Endo/Other  negative endocrine ROS  Renal/GU negative Renal ROS     Musculoskeletal negative musculoskeletal ROS (+) Fibromyalgia -  Abdominal Normal abdominal exam  (+)   Peds negative pediatric ROS (+)  Hematology  (+) anemia ,   Anesthesia Other Findings   Reproductive/Obstetrics                             Anesthesia Physical Anesthesia Plan  ASA: II  Anesthesia Plan: General   Post-op Pain Management:    Induction: Intravenous  Airway Management Planned: Oral ETT  Additional Equipment:   Intra-op Plan:   Post-operative Plan: Extubation in OR  Informed Consent: I have reviewed the patients History and Physical, chart, labs and discussed the procedure including the risks, benefits and alternatives for the proposed anesthesia with the patient or authorized representative who has indicated his/her understanding and acceptance.   Dental advisory given  Plan Discussed with: CRNA and Surgeon  Anesthesia Plan Comments:         Anesthesia Quick Evaluation

## 2016-09-28 NOTE — H&P (Signed)
  She reports no change in condition since office visit.  Labs noted.  Last TB nl.  I discussed plan for lap cholecystectomy.

## 2016-09-29 ENCOUNTER — Encounter: Payer: Self-pay | Admitting: Gastroenterology

## 2016-09-29 DIAGNOSIS — M797 Fibromyalgia: Secondary | ICD-10-CM | POA: Diagnosis not present

## 2016-09-29 DIAGNOSIS — D649 Anemia, unspecified: Secondary | ICD-10-CM | POA: Diagnosis not present

## 2016-09-29 DIAGNOSIS — G43909 Migraine, unspecified, not intractable, without status migrainosus: Secondary | ICD-10-CM | POA: Diagnosis not present

## 2016-09-29 DIAGNOSIS — K8010 Calculus of gallbladder with chronic cholecystitis without obstruction: Secondary | ICD-10-CM | POA: Diagnosis not present

## 2016-09-29 DIAGNOSIS — K8021 Calculus of gallbladder without cholecystitis with obstruction: Secondary | ICD-10-CM | POA: Diagnosis not present

## 2016-09-29 LAB — SURGICAL PATHOLOGY

## 2016-09-29 MED ORDER — HYDROCODONE-ACETAMINOPHEN 5-325 MG PO TABS: 1.0000 | ORAL_TABLET | ORAL | 0 refills | Status: DC | PRN

## 2016-09-29 NOTE — Final Progress Note (Signed)
DISCHARGE SUMMARY  She came in through the outpatient surgery department and had laparoscopic cholecystectomy. She had operative findings of chronic cholecystitis cholelithiasis also choledocholithiasis.  Postoperatively she was admitted to an observation bed. Surgically had ERCP and sphincterotomy.  Today she is making progress doing some walking. She is taking a full liquid diet and tolerating well. Her drain has some serosanguineous drainage and the Blake drain was removed. Dressing applied.  Final diagnosis cholecystitis cholelithiasis and choledocholithiasis  I discussed the plans for follow-up care. See instructions

## 2016-09-29 NOTE — Progress Notes (Signed)
  Angela Miniumarren Anadalay Macdonell, MD [facility]   [address] [place], [place] [address] Phone: [phone] Fax : [phone]   Subjective: Patient with postop pain today. The patient had an ERCP with a stone extraction yesterday. The patient has no signs of pancreatitis at the present time. She has a tray in front of her and is eating.   Objective: Vital signs in last 24 hours: Vitals:   09/28/16 2032 09/29/16 0038 09/29/16 0550 09/29/16 0850  BP: 128/68 123/70 122/68 120/69  Pulse: 80 69 (!) 59 65  Resp: 18 17 17 16   Temp: 98.6 F (37 C) 98.7 F (37.1 C) 98.1 F (36.7 C) 98 F (36.7 C)  TempSrc: Oral Oral Oral Oral  SpO2: 100% 99% 100% 100%  Weight:      Height:       Weight change: 0 lb (0 kg)  Intake/Output Summary (Last 24 hours) at 09/29/16 1259 Last data filed at 09/29/16 1203  Gross per 24 hour  Intake          3069.62 ml  Output             3110 ml  Net           -40.38 ml     Exam: Vital signs stable Heart:: Regular rate and rhythm Lungs: normal    Lab Results: @LABTEST2 @ Micro Results: No results found for this or any previous visit (from the past 240 hour(s)). Studies/Results: Dg Cholangiogram Operative  Result Date: 09/28/2016 CLINICAL DATA:  Cholecystitis. EXAM: INTRAOPERATIVE CHOLANGIOGRAM TECHNIQUE: Cholangiographic images from the C-arm fluoroscopic device were submitted for interpretation post-operatively. Please see the procedural report for the amount of contrast and the fluoroscopy time utilized. COMPARISON:  Ultrasound 08/11/2016 FINDINGS: Opacification of the common bile duct and a portion of the central intrahepatic ducts. Mild biliary dilatation. There is an obstruction at the distal common bile duct most likely related to a stone. There is no drainage into the duodenum. IMPRESSION: Obstruction at the distal common bile duct, most likely due to a stone. Electronically Signed   By: Richarda OverlieAdam  Henn M.D.   On: 09/28/2016 09:30   Medications: I have reviewed  the patient's current medications. Scheduled Meds: Continuous Infusions: . dextrose 5 % and 0.2 % NaCl with KCl 20 mEq 125 mL/hr at 09/29/16 0623   PRN Meds:.acetaminophen **OR** acetaminophen, HYDROcodone-acetaminophen, morphine injection   Assessment: Active Problems:   Cholelithiasis with biliary obstruction    Plan: Patient status post ERCP with some postop pain. Nothing further from a GI point of view. Patient's symptoms not consistent with postop pancreatitis. I will sign off.  Please call if any further GI concerns or questions.  We would like to thank you for the opportunity to participate in the care of Angela Dougherty.    LOS: 0 days   Angela MiniumDarren Malu Pellegrini 09/29/2016, 12:59 PM

## 2016-09-29 NOTE — Progress Notes (Signed)
Patient requesting to have her diet changed.  She wants to eat mashed potatoes.  Dr Katrinka BlazingSmith gave order for full liquid diet

## 2016-09-29 NOTE — Discharge Instructions (Signed)
Take Tylenol or Norco as needed for pain.  Should not drive or do anything dangerous when taking Norco.  Remove dressing on Sunday. Made then shower and blot dry. Apply gauze as needed.

## 2016-10-26 DIAGNOSIS — H5213 Myopia, bilateral: Secondary | ICD-10-CM | POA: Diagnosis not present

## 2016-11-17 DIAGNOSIS — Z0000 Encounter for general adult medical examination without abnormal findings: Secondary | ICD-10-CM | POA: Diagnosis not present

## 2016-11-17 DIAGNOSIS — Z13220 Encounter for screening for lipoid disorders: Secondary | ICD-10-CM | POA: Diagnosis not present

## 2016-11-17 DIAGNOSIS — R7309 Other abnormal glucose: Secondary | ICD-10-CM | POA: Diagnosis not present

## 2016-11-17 DIAGNOSIS — E6609 Other obesity due to excess calories: Secondary | ICD-10-CM | POA: Diagnosis not present

## 2016-11-17 DIAGNOSIS — Z79899 Other long term (current) drug therapy: Secondary | ICD-10-CM | POA: Diagnosis not present

## 2016-11-17 DIAGNOSIS — Z8639 Personal history of other endocrine, nutritional and metabolic disease: Secondary | ICD-10-CM | POA: Diagnosis not present

## 2017-02-21 IMAGING — US US ABDOMEN LIMITED
1 series · 14 of 25 positions shown · non-contrast
Comparison: 09/10/2015

CLINICAL DATA: Right upper quadrant pain with nausea and vomiting
for 6 days.

EXAM:
US ABDOMEN LIMITED - RIGHT UPPER QUADRANT

[Series 1: us abdomen limited · 0.25mm/px · 14 of 70 slices shown]
[im 1/70]
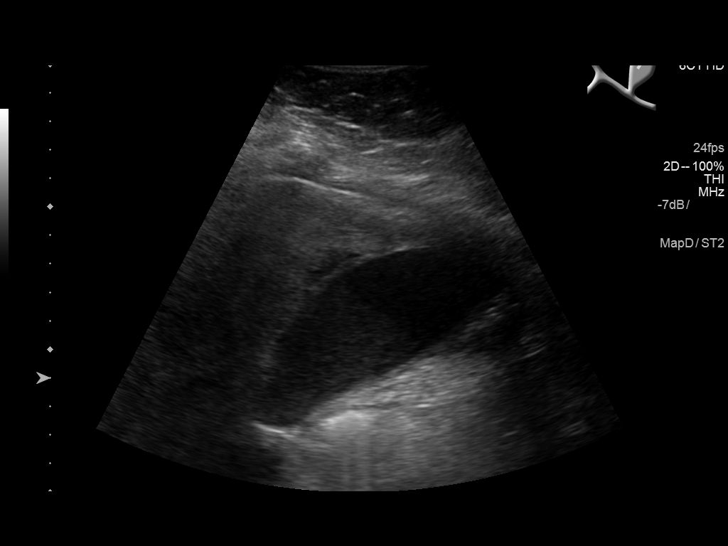
[im 6/70]
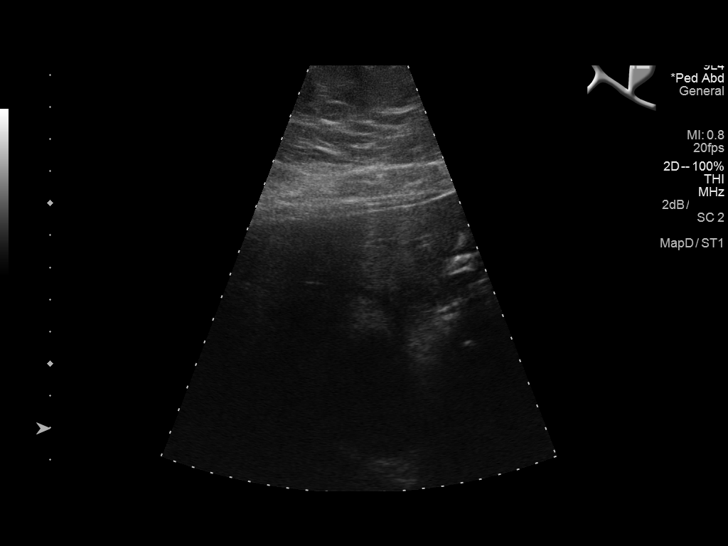
[im 12/70]
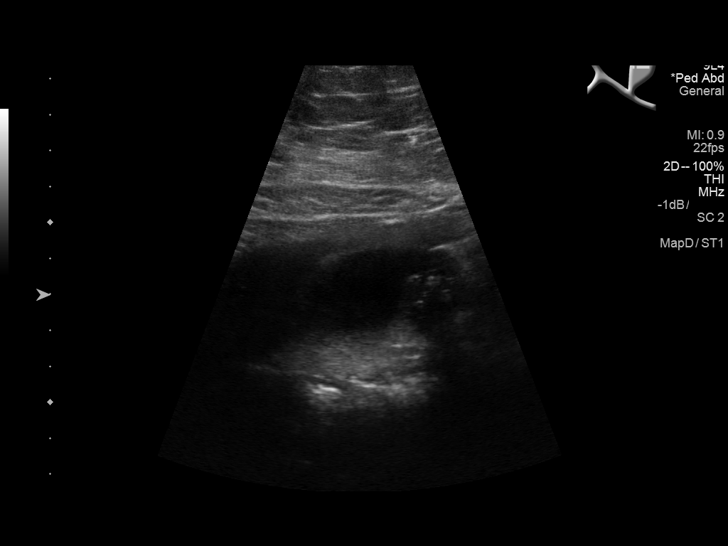
[im 18/70]
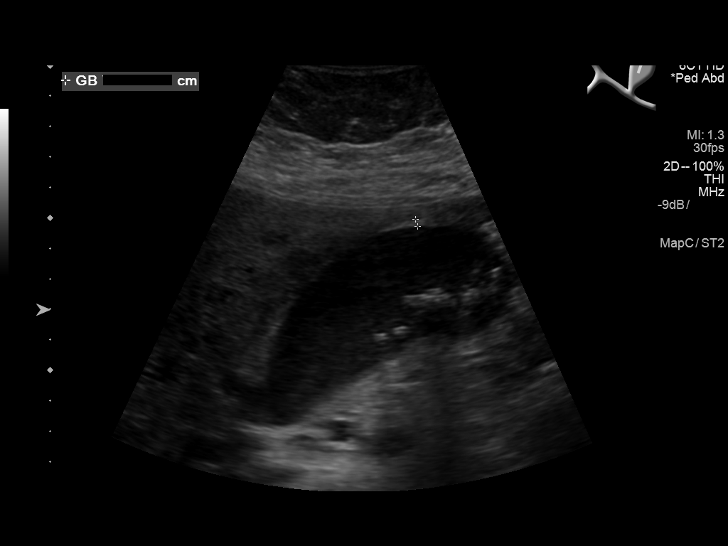
[im 24/70]
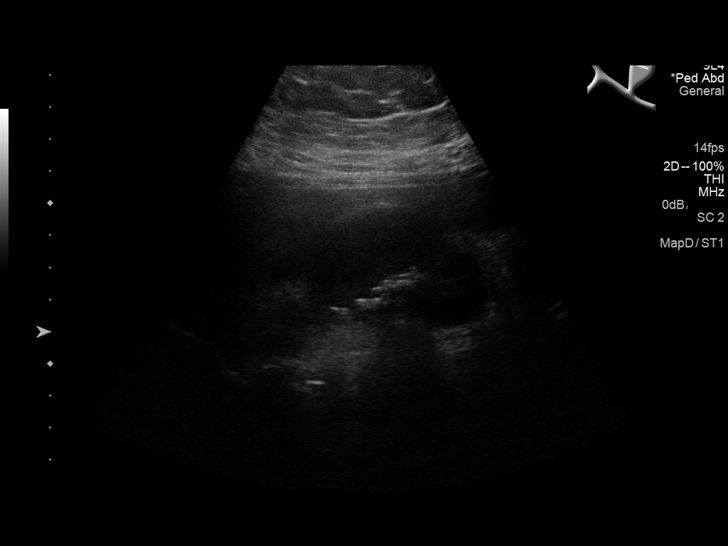
[im 26/70]
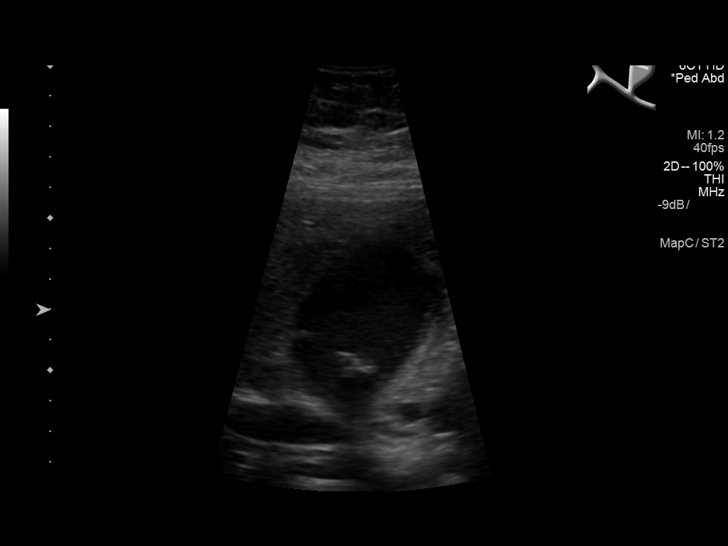
[im 32/70]
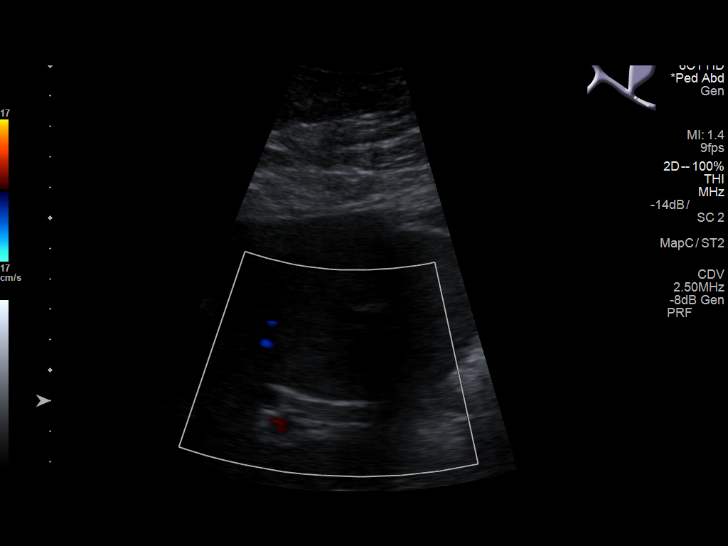
[im 38/70]
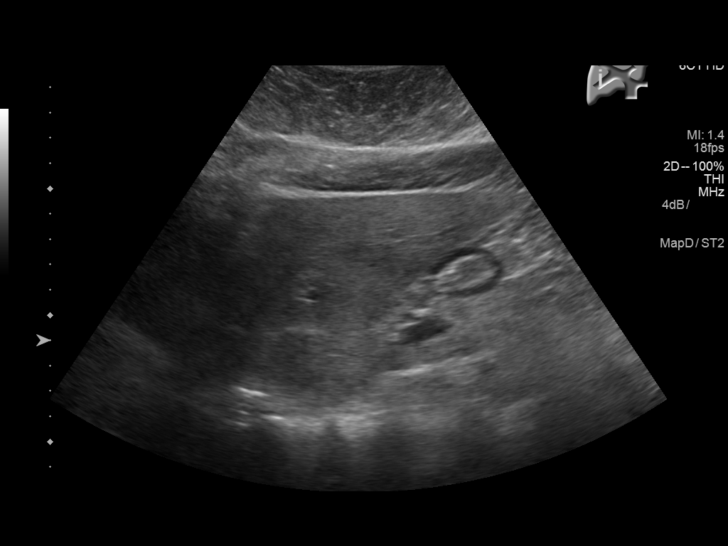
[im 44/70]
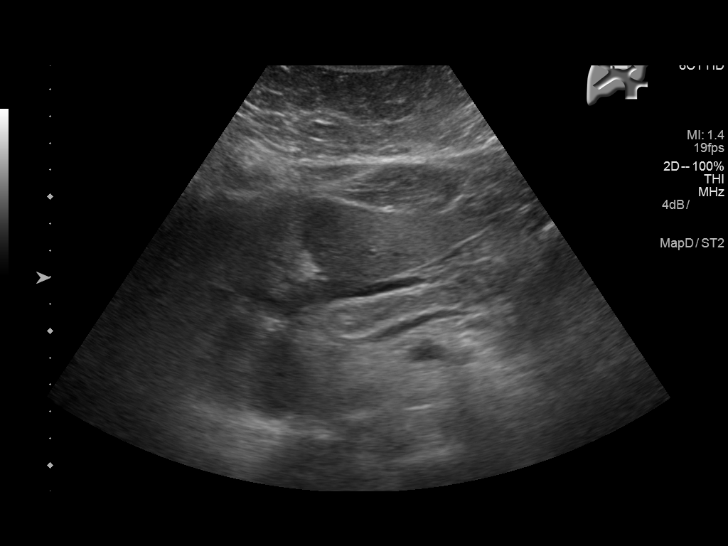
[im 47/70]
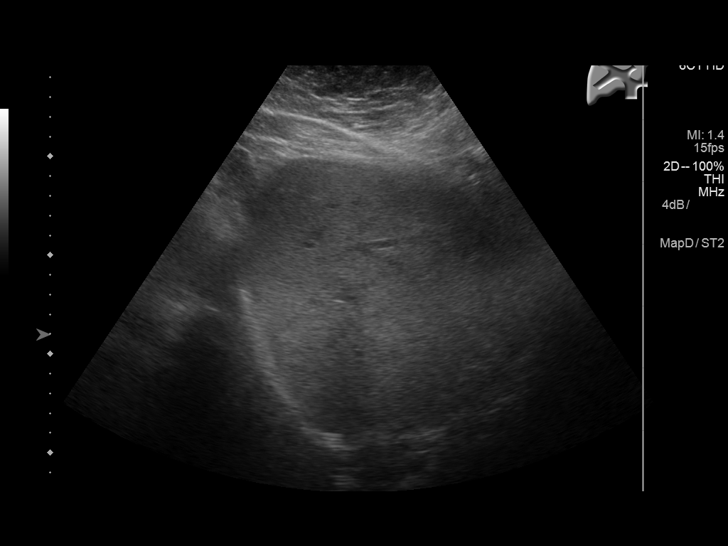
[im 52/70]
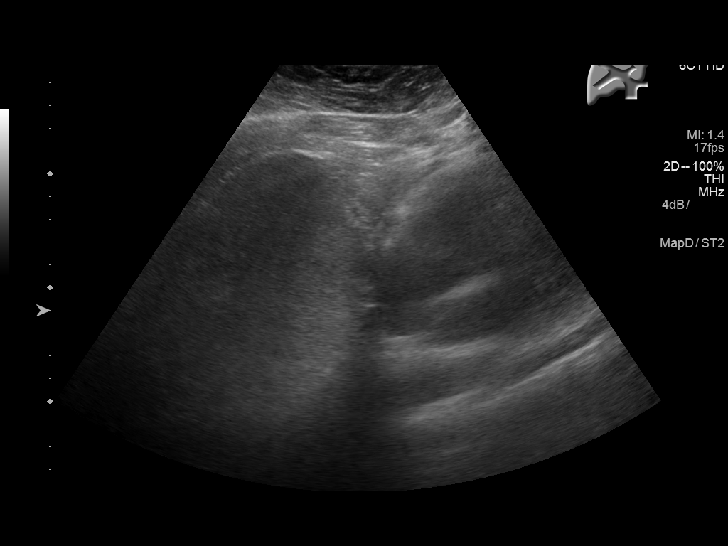
[im 58/70]
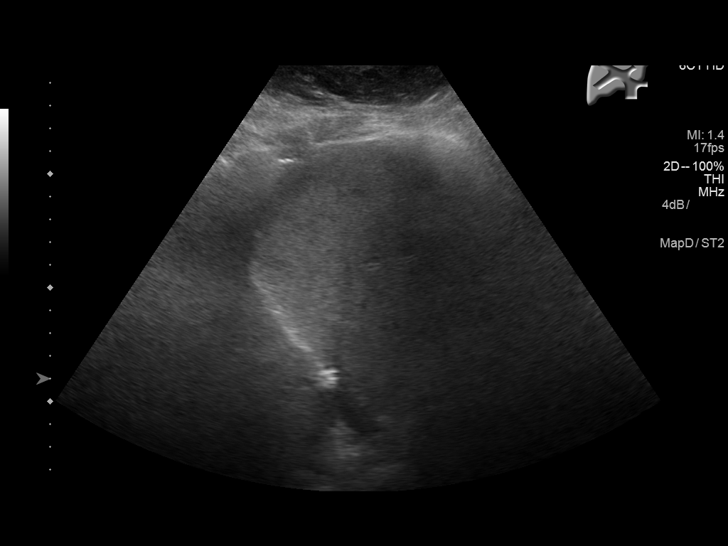
[im 64/70]
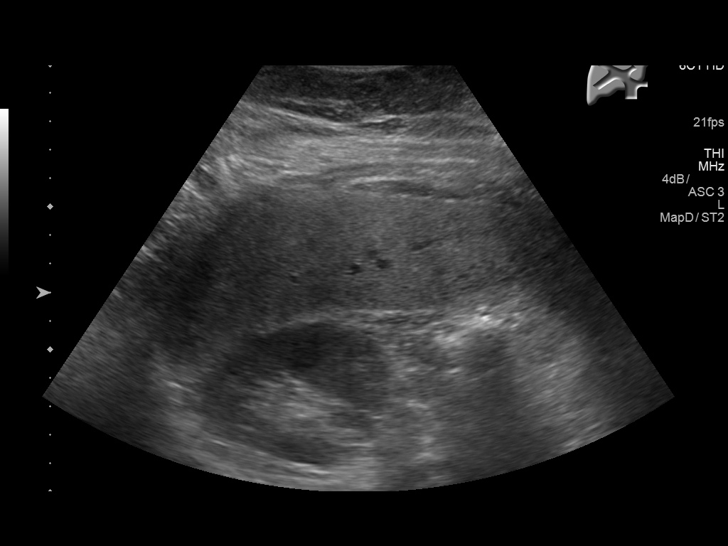
[im 70/70]
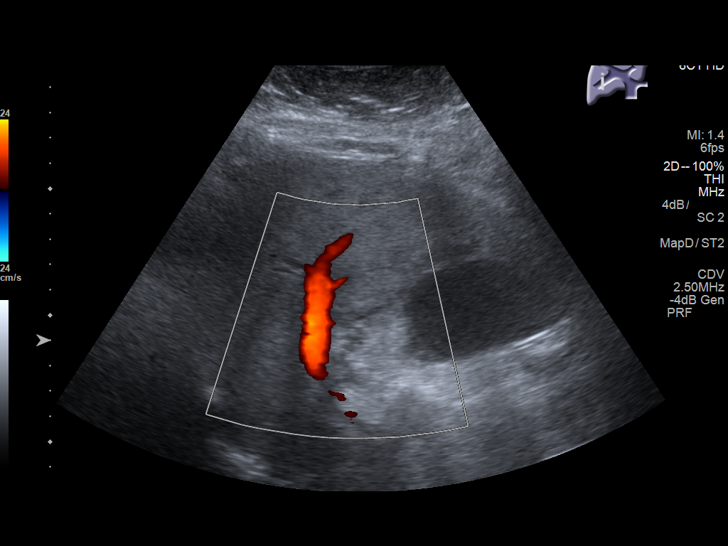

[14 of 25 positions shown; findings below may reference images not displayed]

FINDINGS: Gallbladder:

Numerous tiny less than 1 cm gallstones are seen as well as layering
echogenic sludge. No evidence of gallbladder wall thickening or
pericholecystic fluid. No sonographic Murphy sign noted by
sonographer.

Common bile duct:

Diameter: 6 mm, within normal limits.

Liver:

Diffusely increased echogenicity of the hepatic parenchyma has
increased since previous study, consistent with diffuse hepatic
steatosis. No focal mass lesion identified.
IMPRESSION: Cholelithiasis and gallbladder sludge. No definite sonographic signs
of acute cholecystitis or biliary dilatation.

Diffuse hepatic steatosis.

## 2017-11-01 DIAGNOSIS — H5213 Myopia, bilateral: Secondary | ICD-10-CM | POA: Diagnosis not present

## 2017-11-19 DIAGNOSIS — M25572 Pain in left ankle and joints of left foot: Secondary | ICD-10-CM | POA: Diagnosis not present

## 2017-11-19 DIAGNOSIS — E6609 Other obesity due to excess calories: Secondary | ICD-10-CM | POA: Diagnosis not present

## 2017-11-19 DIAGNOSIS — Z8639 Personal history of other endocrine, nutritional and metabolic disease: Secondary | ICD-10-CM | POA: Diagnosis not present

## 2017-11-19 DIAGNOSIS — R7309 Other abnormal glucose: Secondary | ICD-10-CM | POA: Diagnosis not present

## 2017-11-19 DIAGNOSIS — Z0000 Encounter for general adult medical examination without abnormal findings: Secondary | ICD-10-CM | POA: Diagnosis not present

## 2017-11-19 DIAGNOSIS — Z13220 Encounter for screening for lipoid disorders: Secondary | ICD-10-CM | POA: Diagnosis not present

## 2017-11-19 DIAGNOSIS — R51 Headache: Secondary | ICD-10-CM | POA: Diagnosis not present

## 2017-12-24 DIAGNOSIS — R1084 Generalized abdominal pain: Secondary | ICD-10-CM | POA: Diagnosis not present

## 2017-12-24 DIAGNOSIS — R112 Nausea with vomiting, unspecified: Secondary | ICD-10-CM | POA: Diagnosis not present

## 2018-01-05 IMAGING — CT CT ABD-PELV W/ CM
1 of 2 series · 15 of 32 positions shown, 19 images · IV contrast (omnipaque)
Comparison: CT scan of August 09, 2012.

CLINICAL DATA: Chronic right upper quadrant abdominal pain.

EXAM:
CT ABDOMEN AND PELVIS WITH CONTRAST
TECHNIQUE: Multidetector CT imaging of the abdomen and pelvis was performed
using the standard protocol following bolus administration of
intravenous contrast.
CONTRAST:  100mL OMNIPAQUE IOHEXOL 350 MG/ML SOLN

[Series 2: axial st · axial · 0.66mm/px · z∈[-960,-525]mm · 15 of 95 slices shown, 19 images]
[im 4/95  soft-tissue]
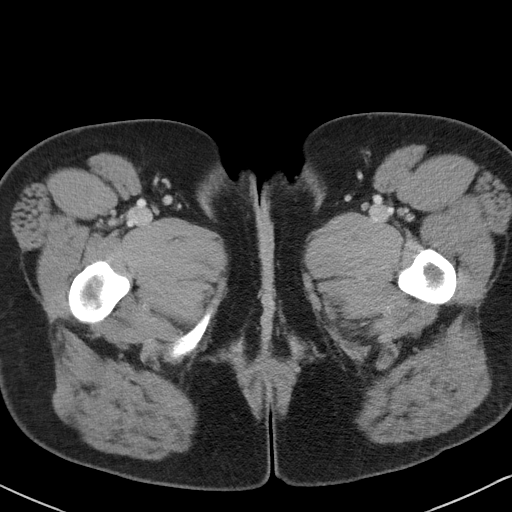
[im 4/95  bone]
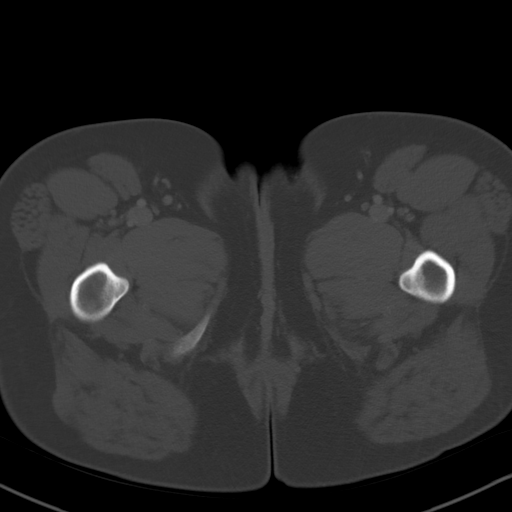
[im 12/95  soft-tissue]
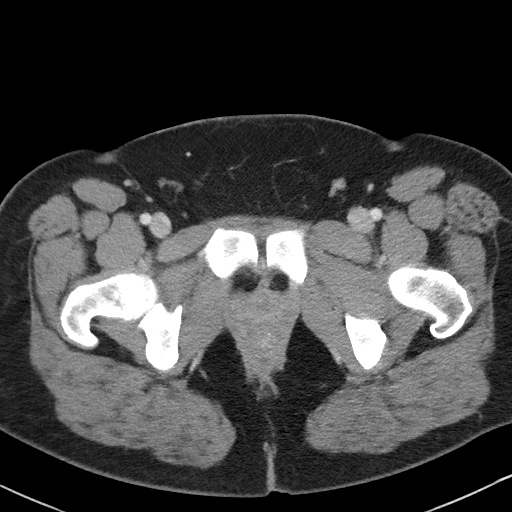
[im 20/95  soft-tissue]
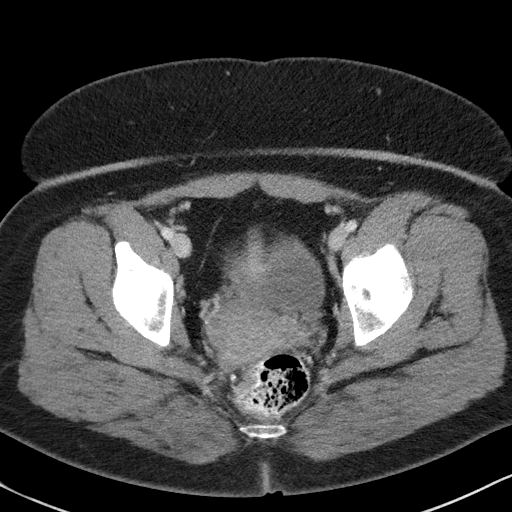
[im 28/95  soft-tissue]
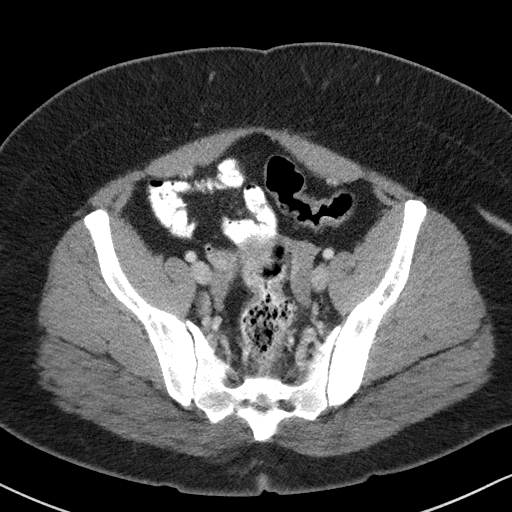
[im 32/95  soft-tissue]
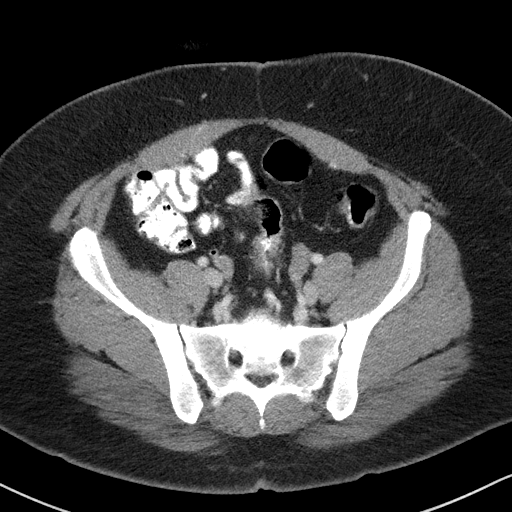
[im 40/95  soft-tissue]
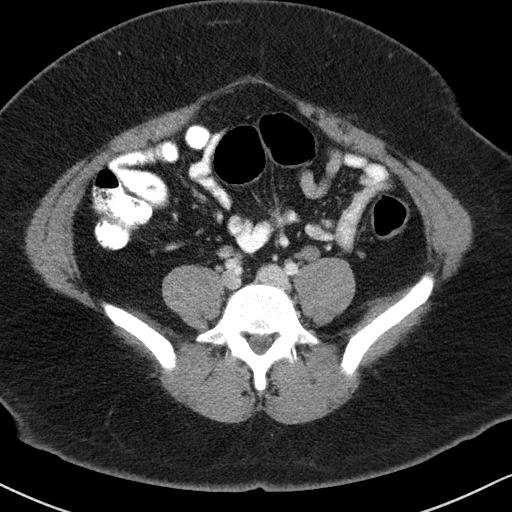
[im 48/95  soft-tissue]
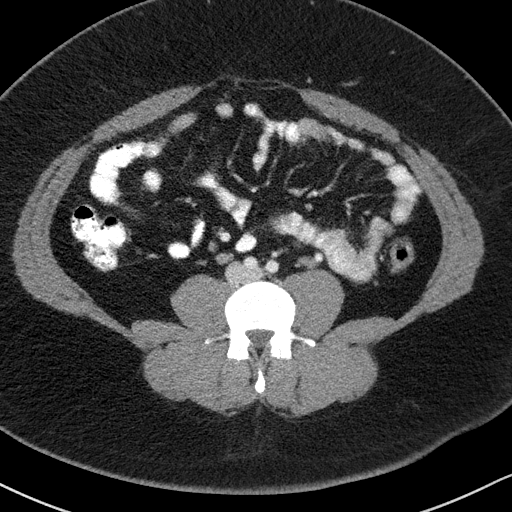
[im 55/95  soft-tissue]
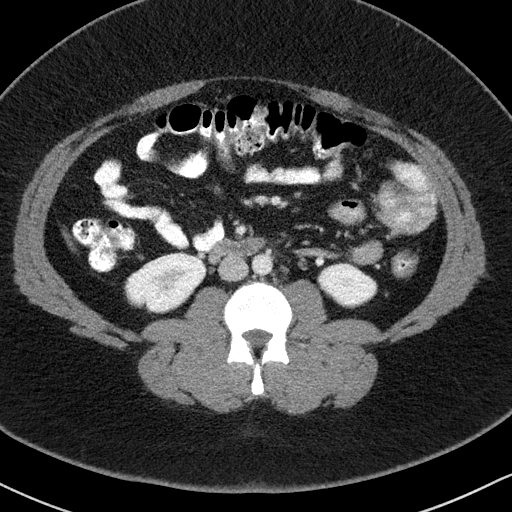
[im 63/95  soft-tissue]
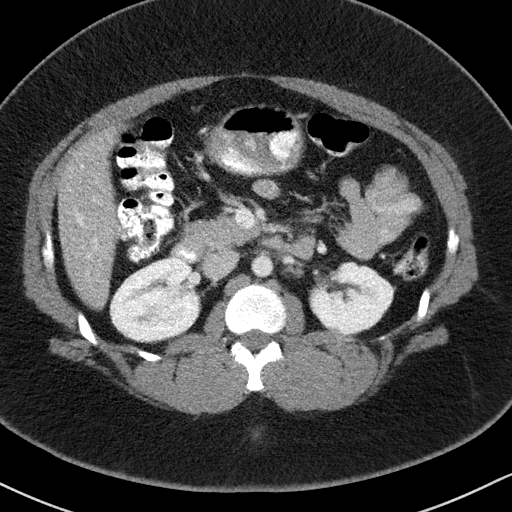
[im 63/95  bone]
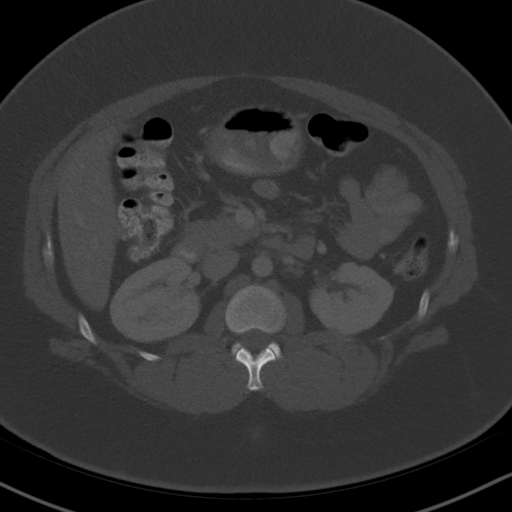
[im 67/95  soft-tissue]
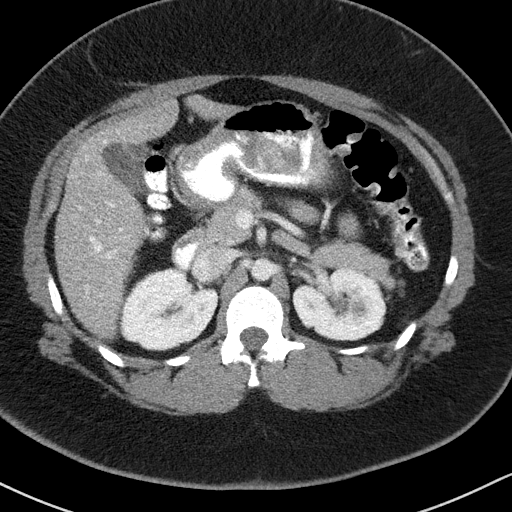
[im 75/95  soft-tissue]
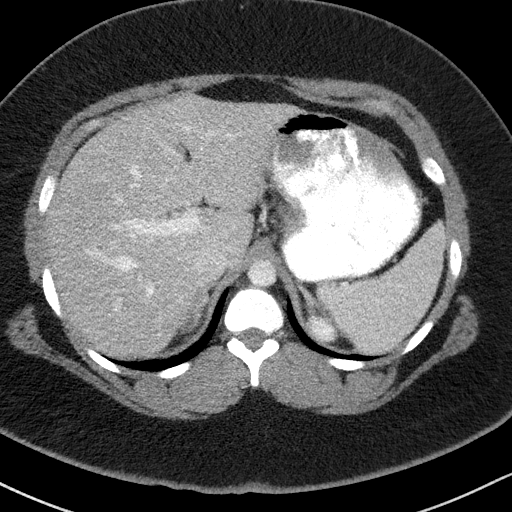
[im 79/95  lung]
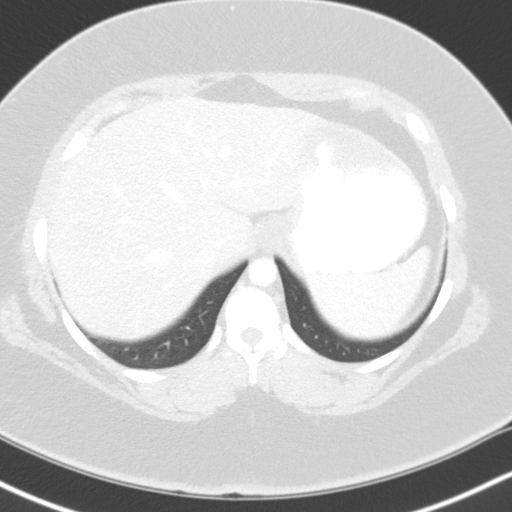
[im 83/95  soft-tissue]
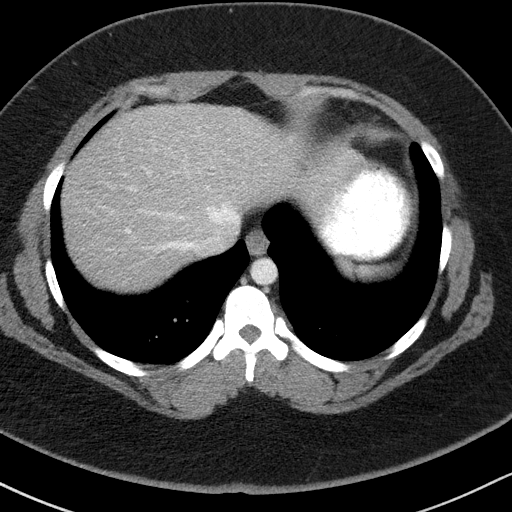
[im 83/95  lung]
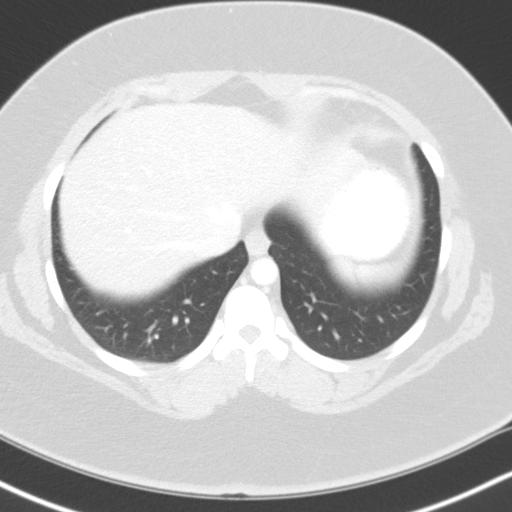
[im 87/95  lung]
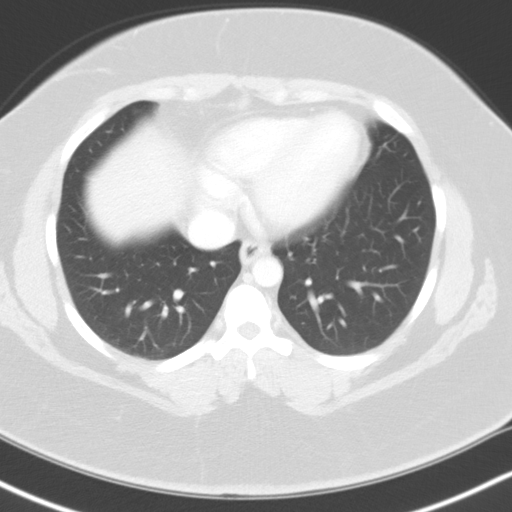
[im 91/95  soft-tissue]
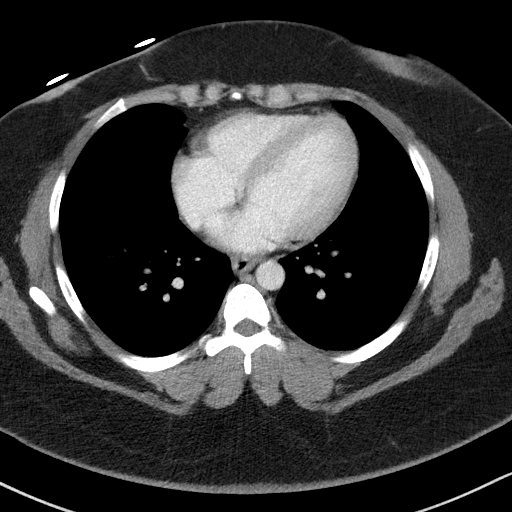
[im 91/95  lung]
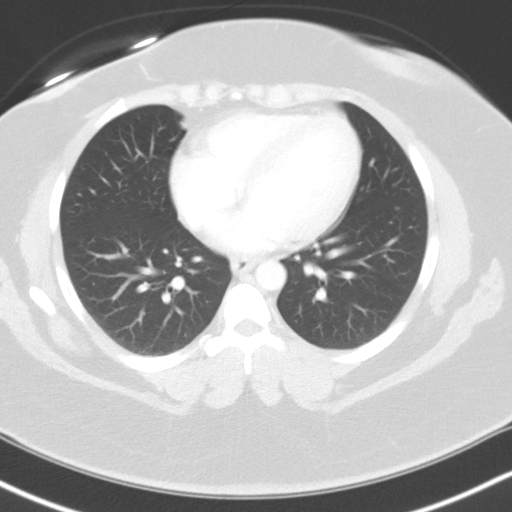

[15 of 32 positions shown; findings below may reference images not displayed]

FINDINGS: Visualized lung bases are unremarkable. No significant osseous
abnormality is noted.

No gallstones are noted. The liver, spleen and pancreas appear
normal. Adrenal glands and kidneys appear normal except for stable
left renal cyst. No hydronephrosis or renal obstruction is noted. No
renal or ureteral calculi are noted currently. The appendix appears
normal. There is no evidence of bowel obstruction. No abnormal fluid
collection is noted. Uterus and ovaries appear normal. Urinary
bladder appears normal. No significant adenopathy is noted.
IMPRESSION: No significant abnormality seen in the abdomen or pelvis.

## 2019-12-27 ENCOUNTER — Telehealth (HOSPITAL_COMMUNITY): Payer: Self-pay | Admitting: Emergency Medicine

## 2019-12-27 ENCOUNTER — Encounter: Payer: Self-pay | Admitting: Emergency Medicine

## 2019-12-27 ENCOUNTER — Other Ambulatory Visit: Payer: Self-pay

## 2019-12-27 ENCOUNTER — Ambulatory Visit
Admission: EM | Admit: 2019-12-27 | Discharge: 2019-12-27 | Disposition: A | Discharge status: Home / Self Care | Payer: No Typology Code available for payment source | Attending: Family Medicine | Admitting: Family Medicine

## 2019-12-27 DIAGNOSIS — M7918 Myalgia, other site: Secondary | ICD-10-CM

## 2019-12-27 MED ORDER — TIZANIDINE HCL 4 MG PO TABS: 4.0000 mg | ORAL_TABLET | Freq: Three times a day (TID) | ORAL | 0 refills | Status: DC | PRN

## 2019-12-27 MED ORDER — KETOROLAC TROMETHAMINE 10 MG PO TABS: 10.0000 mg | ORAL_TABLET | Freq: Four times a day (QID) | ORAL | 0 refills | Status: DC | PRN

## 2019-12-27 NOTE — Discharge Instructions (Signed)
Rest.  Ice and heat.  Medication as needed.  Take care  Dr. Aneisa Karren  
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
## 2019-12-29 ENCOUNTER — Ambulatory Visit
Admission: EM | Admit: 2019-12-29 | Discharge: 2019-12-29 | Disposition: A | Discharge status: Home / Self Care | Payer: No Typology Code available for payment source | Attending: Family Medicine | Admitting: Family Medicine

## 2019-12-30 ENCOUNTER — Other Ambulatory Visit: Payer: Self-pay

## 2019-12-30 ENCOUNTER — Encounter: Payer: Self-pay | Admitting: Emergency Medicine

## 2019-12-30 ENCOUNTER — Emergency Department: Payer: No Typology Code available for payment source

## 2019-12-30 ENCOUNTER — Emergency Department
Admission: EM | Admit: 2019-12-30 | Discharge: 2019-12-30 | Disposition: A | Discharge status: Home / Self Care | Payer: No Typology Code available for payment source | Attending: Emergency Medicine | Admitting: Emergency Medicine

## 2019-12-30 DIAGNOSIS — M7918 Myalgia, other site: Secondary | ICD-10-CM

## 2019-12-30 DIAGNOSIS — Y93I9 Activity, other involving external motion: Secondary | ICD-10-CM | POA: Insufficient documentation

## 2019-12-30 DIAGNOSIS — M791 Myalgia, unspecified site: Secondary | ICD-10-CM | POA: Insufficient documentation

## 2019-12-30 DIAGNOSIS — Y999 Unspecified external cause status: Secondary | ICD-10-CM | POA: Diagnosis not present

## 2019-12-30 DIAGNOSIS — M542 Cervicalgia: Secondary | ICD-10-CM | POA: Diagnosis not present

## 2019-12-30 DIAGNOSIS — Y92410 Unspecified street and highway as the place of occurrence of the external cause: Secondary | ICD-10-CM | POA: Diagnosis not present

## 2019-12-30 DIAGNOSIS — Z975 Presence of (intrauterine) contraceptive device: Secondary | ICD-10-CM | POA: Diagnosis not present

## 2019-12-30 NOTE — Discharge Instructions (Signed)
Follow discharge care instructions and take medication as directed. 
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
## 2020-08-10 NOTE — Progress Notes (Signed)
Angela Arbour, MD   Chief Complaint  Patient presents with   IUD check    HPI:      [name] is a 34 y.o. G1P0 whose LMP was No LMP recorded. (Menstrual status: IUD)., presents today for NP> 3 yrs IUD check/IUD removal. Mirena placed 05/19/16. Menses are random, last 5 days, light flow, mild dysmen. Would like to change to OCPs. Did in past without side effects. No hx of HTN, DVTs, migraines with aura.  She is sex active, no dyspareunia/bleeding.  Last pap 01/27/16 was neg cells/neg HPV DNA. No hx of abn with tx. Hx of syphilis in past with tx.  No tob/alcohol/drug use Does get adequate ca but not Vit D in diet. Labs with PCP   Past Medical History:  Diagnosis Date   Anemia    H/O 8 YRSGO   Gallbladder disease    Herpes    Syphilis     Past Surgical History:  Procedure Laterality Date   CHOLECYSTECTOMY N/A 09/28/2016   Procedure: LAPAROSCOPIC CHOLECYSTECTOMY WITH INTRAOPERATIVE CHOLANGIOGRAM;  Surgeon: Nadeen Landau, MD;  Location: [facility];  Service: General;  Laterality: N/A;   DILATION AND CURETTAGE OF UTERUS  March 31st, 2016   ENDOSCOPIC RETROGRADE CHOLANGIOPANCREATOGRAPHY (ERCP) WITH PROPOFOL N/A 09/28/2016   Procedure: ENDOSCOPIC RETROGRADE CHOLANGIOPANCREATOGRAPHY (ERCP) WITH PROPOFOL;  Surgeon: Midge Minium, MD;  Location: [facility];  Service: Endoscopy;  Laterality: N/A;    Family History  Problem Relation Age of Onset   Breast cancer Mother        mid 51s   Hypertension Mother    Gallbladder disease Mother    Asthma Daughter     Social History   Socioeconomic History   Marital status: Single    Spouse name: Not on file   Number of children: Not on file   Years of education: Not on file   Highest education level: Not on file  Occupational History   Not on file  Tobacco Use   Smoking status: Never Smoker   Smokeless tobacco: Never Used  Vaping Use   Vaping Use: Never used  Substance and Sexual  Activity   Alcohol use: No   Drug use: No   Sexual activity: Yes    Birth control/protection: I.U.D.    Comment: Mirena  Other Topics Concern   Not on file  Social History Narrative   Not on file   Social Determinants of Health   Financial Resource Strain:    Difficulty of Paying Living Expenses: Not on file  Food Insecurity:    Worried About Programme researcher, broadcasting/film/video in the Last Year: Not on file   [employer] of Food in the Last Year: Not on file  Transportation Needs:    Lack of Transportation (Medical): Not on file   Lack of Transportation (Non-Medical): Not on file  Physical Activity:    Days of Exercise per Week: Not on file   Minutes of Exercise per Session: Not on file  Stress:    Feeling of Stress : Not on file  Social Connections:    Frequency of Communication with Friends and Family: Not on file   Frequency of Social Gatherings with Friends and Family: Not on file   Attends Religious Services: Not on file   Active Member of Clubs or Organizations: Not on file   Attends Banker Meetings: Not on file   Marital Status: Not on file  Intimate Partner Violence:  Fear of Current or Ex-Partner: Not on file   Emotionally Abused: Not on file   Physically Abused: Not on file   Sexually Abused: Not on file    Outpatient Medications Prior to Visit  Medication Sig Dispense Refill   levonorgestrel (MIRENA) 20 MCG/24HR IUD 1 each by Intrauterine route once.     ketorolac (TORADOL) 10 MG tablet Take 1 tablet (10 mg total) by mouth every 6 (six) hours as needed for moderate pain or severe pain. 20 tablet 0   tiZANidine (ZANAFLEX) 4 MG tablet Take 1 tablet (4 mg total) by mouth every 8 (eight) hours as needed for muscle spasms (Low back pain). 30 tablet 0   No facility-administered medications prior to visit.      ROS:  Review of Systems  Constitutional: Negative for fever.  Gastrointestinal: Negative for blood in stool, constipation,  diarrhea, nausea and vomiting.  Genitourinary: Negative for dyspareunia, dysuria, flank pain, frequency, hematuria, urgency, vaginal bleeding, vaginal discharge and vaginal pain.  Musculoskeletal: Negative for back pain.  Skin: Negative for rash.    OBJECTIVE:   Vitals:  BP 130/90    Ht 5\' 2"  (1.575 m)    Wt 216 lb (98 kg)    BMI 39.51 kg/m   Physical Exam Vitals reviewed.  Constitutional:      Appearance: She is well-developed.  Pulmonary:     Effort: Pulmonary effort is normal.  Genitourinary:    General: Normal vulva.     Pubic Area: No rash.      Labia:        Right: No rash, tenderness or lesion.        Left: No rash, tenderness or lesion.      Vagina: Normal. No vaginal discharge, erythema or tenderness.     Cervix: Normal.     Uterus: Normal. Not enlarged and not tender.      Adnexa: Right adnexa normal and left adnexa normal.       Right: No mass or tenderness.         Left: No mass or tenderness.       Comments: IUD STRINGS NOT IN CX OS Musculoskeletal:        General: Normal range of motion.     Cervical back: Normal range of motion.  Skin:    General: Skin is warm and dry.  Neurological:     General: No focal deficit present.     Mental Status: She is alert and oriented to person, place, and time.  Psychiatric:        Mood and Affect: Mood normal.        Behavior: Behavior normal.        Thought Content: Thought content normal.        Judgment: Judgment normal.    IUD Removal Strings of IUD identified and grasped.  IUD removed without problem with Boseman forceps.  Pt tolerated this well.  IUD noted to be intact.  Assessment:  Encounter for initial prescription of contraceptive pills - Plan: Norethindrone Acetate-Ethinyl Estrad-FE (MICROGESTIN 24 FE) 1-20 MG-MCG(24) tablet; OCP start today. Condoms for 1 wk.  Encounter for IUD removal--tolerated well  Cervical cancer screening - Plan: Cytology - PAP  Screening for HPV (human papillomavirus) - Plan:  Cytology - PAP   Meds ordered this encounter  Medications   Norethindrone Acetate-Ethinyl Estrad-FE (MICROGESTIN 24 FE) 1-20 MG-MCG(24) tablet    Sig: Take 1 tablet by mouth daily.    Dispense:  84 tablet  Refill:  3    Order Specific Question:   Supervising Provider    Answer:   Nadara Mustard [[id number]]      Return in about 1 year (around 08/11/2021).  Lalitha Ilyas B. Korayma Hagwood, PA-C 08/11/2020 10:20 AM

## 2020-08-10 NOTE — Patient Instructions (Signed)
I value your feedback and entrusting us with your care. If you get a [facility] patient survey, I would appreciate you taking the time to let us know about your experience today. Thank you!  As of October 23, 2019, your lab results will be released to your MyChart immediately, before I even have a chance to see them. Please give me time to review them and contact you if there are any abnormalities. Thank you for your patience.  

## 2020-08-11 ENCOUNTER — Encounter: Payer: Self-pay | Admitting: Obstetrics and Gynecology

## 2020-08-11 ENCOUNTER — Ambulatory Visit (INDEPENDENT_AMBULATORY_CARE_PROVIDER_SITE_OTHER): Payer: No Typology Code available for payment source | Admitting: Obstetrics and Gynecology

## 2020-08-11 ENCOUNTER — Other Ambulatory Visit: Payer: Self-pay

## 2020-08-11 ENCOUNTER — Other Ambulatory Visit (HOSPITAL_COMMUNITY)
Admission: RE | Admit: 2020-08-11 | Discharge: 2020-08-11 | Disposition: A | Discharge status: Home / Self Care | Payer: No Typology Code available for payment source | Source: Ambulatory Visit | Attending: Obstetrics and Gynecology | Admitting: Obstetrics and Gynecology

## 2020-08-11 VITALS — BP 130/90 | Ht 62.0 in | Wt 216.0 lb

## 2020-08-11 DIAGNOSIS — Z1151 Encounter for screening for human papillomavirus (HPV): Secondary | ICD-10-CM | POA: Insufficient documentation

## 2020-08-11 DIAGNOSIS — Z124 Encounter for screening for malignant neoplasm of cervix: Secondary | ICD-10-CM | POA: Diagnosis present

## 2020-08-11 DIAGNOSIS — Z30432 Encounter for removal of intrauterine contraceptive device: Secondary | ICD-10-CM

## 2020-08-11 DIAGNOSIS — Z30011 Encounter for initial prescription of contraceptive pills: Secondary | ICD-10-CM | POA: Diagnosis not present

## 2020-08-11 MED ORDER — MICROGESTIN 24 FE 1-20 MG-MCG PO TABS: 1.0000 | ORAL_TABLET | Freq: Every day | ORAL | 3 refills | Status: AC

## 2020-08-12 LAB — CYTOLOGY - PAP
Comment: NEGATIVE
Diagnosis: NEGATIVE
High risk HPV: NEGATIVE

## 2022-03-22 ENCOUNTER — Other Ambulatory Visit: Payer: Self-pay

## 2022-03-22 MED ORDER — PHENTERMINE HCL 37.5 MG PO TABS
ORAL_TABLET | ORAL | 2 refills | Status: AC
  Filled 2022-03-22: qty 30, 30d supply, fill #0
  Filled 2022-04-24: qty 30, 30d supply, fill #1

## 2022-04-24 ENCOUNTER — Other Ambulatory Visit: Payer: Self-pay

## 2022-06-23 ENCOUNTER — Other Ambulatory Visit: Payer: Self-pay

## 2022-06-23 MED ORDER — PHENTERMINE HCL 37.5 MG PO TABS
ORAL_TABLET | ORAL | 2 refills | Status: AC
  Filled 2022-06-23: qty 30, 30d supply, fill #0
  Filled 2022-09-01: qty 30, 30d supply, fill #1

## 2022-06-23 MED ORDER — TOPIRAMATE 50 MG PO TABS
50.0000 mg | ORAL_TABLET | Freq: Every day | ORAL | 5 refills | Status: AC
  Filled 2022-06-23: qty 30, 30d supply, fill #0
  Filled 2022-09-01: qty 30, 30d supply, fill #1

## 2022-09-01 ENCOUNTER — Other Ambulatory Visit: Payer: Self-pay

## 2022-09-11 ENCOUNTER — Other Ambulatory Visit: Payer: Self-pay

## 2022-09-26 ENCOUNTER — Other Ambulatory Visit: Payer: Self-pay

## 2022-09-26 MED ORDER — PHENTERMINE HCL 37.5 MG PO TABS: 37.5000 mg | ORAL_TABLET | Freq: Every morning | ORAL | 2 refills | Status: AC

## 2022-09-26 MED ORDER — TOPIRAMATE 50 MG PO TABS
50.0000 mg | ORAL_TABLET | Freq: Two times a day (BID) | ORAL | 5 refills | Status: AC
  Filled 2022-09-26: qty 60, 30d supply, fill #0

## 2022-10-10 ENCOUNTER — Other Ambulatory Visit: Payer: Self-pay

## 2022-12-27 ENCOUNTER — Other Ambulatory Visit: Payer: Self-pay

## 2022-12-27 DIAGNOSIS — I10 Essential (primary) hypertension: Secondary | ICD-10-CM | POA: Diagnosis not present

## 2022-12-27 DIAGNOSIS — R7309 Other abnormal glucose: Secondary | ICD-10-CM | POA: Diagnosis not present

## 2022-12-27 MED ORDER — PHENTERMINE HCL 37.5 MG PO TABS
37.5000 mg | ORAL_TABLET | Freq: Every morning | ORAL | 2 refills | Status: AC
  Filled 2022-12-27: qty 30, 30d supply, fill #0
  Filled 2023-06-12: qty 30, 30d supply, fill #1

## 2023-04-05 ENCOUNTER — Other Ambulatory Visit: Payer: Self-pay

## 2023-04-05 DIAGNOSIS — I10 Essential (primary) hypertension: Secondary | ICD-10-CM | POA: Diagnosis not present

## 2023-04-05 DIAGNOSIS — G4733 Obstructive sleep apnea (adult) (pediatric): Secondary | ICD-10-CM | POA: Diagnosis not present

## 2023-04-05 DIAGNOSIS — Z0000 Encounter for general adult medical examination without abnormal findings: Secondary | ICD-10-CM | POA: Diagnosis not present

## 2023-04-05 DIAGNOSIS — R7309 Other abnormal glucose: Secondary | ICD-10-CM | POA: Diagnosis not present

## 2023-04-05 DIAGNOSIS — Z79899 Other long term (current) drug therapy: Secondary | ICD-10-CM | POA: Diagnosis not present

## 2023-04-05 MED ORDER — WEGOVY 0.25 MG/0.5ML ~~LOC~~ SOAJ
0.5000 mL | SUBCUTANEOUS | 0 refills | Status: AC
  Filled 2023-04-05: qty 2, 28d supply, fill #0

## 2023-06-12 ENCOUNTER — Other Ambulatory Visit: Payer: Self-pay

## 2023-06-13 ENCOUNTER — Other Ambulatory Visit: Payer: Self-pay
